# Patient Record
Sex: Male | Born: 1940 | Race: White | Hispanic: No | Marital: Married | Smoking: Never smoker
Health system: Southern US, Community
[De-identification: ages and names within clinical notes are randomized; demographics above are authoritative.]

## PROBLEM LIST (undated history)

## (undated) DIAGNOSIS — Z8711 Personal history of peptic ulcer disease: Secondary | ICD-10-CM

## (undated) DIAGNOSIS — Z8619 Personal history of other infectious and parasitic diseases: Secondary | ICD-10-CM

## (undated) DIAGNOSIS — Z8719 Personal history of other diseases of the digestive system: Secondary | ICD-10-CM

## (undated) HISTORY — PX: APPENDECTOMY: SHX54

## (undated) HISTORY — DX: Personal history of other diseases of the digestive system: Z87.19

## (undated) HISTORY — DX: Personal history of peptic ulcer disease: Z87.11

## (undated) HISTORY — DX: Personal history of other infectious and parasitic diseases: Z86.19

---

## 1982-07-09 HISTORY — PX: CHOLECYSTECTOMY: SHX55

## 2003-07-10 HISTORY — PX: COLONOSCOPY: SHX174

## 2006-07-09 HISTORY — PX: ESOPHAGOGASTRODUODENOSCOPY ENDOSCOPY: SHX5814

## 2007-05-20 ENCOUNTER — Other Ambulatory Visit: Payer: Self-pay

## 2007-05-20 ENCOUNTER — Inpatient Hospital Stay: Payer: Self-pay | Admitting: Internal Medicine

## 2007-05-24 ENCOUNTER — Other Ambulatory Visit: Payer: Self-pay

## 2007-05-24 ENCOUNTER — Inpatient Hospital Stay: Payer: Self-pay | Admitting: Internal Medicine

## 2007-05-30 ENCOUNTER — Other Ambulatory Visit: Payer: Self-pay

## 2007-06-01 ENCOUNTER — Other Ambulatory Visit: Payer: Self-pay

## 2007-06-22 DIAGNOSIS — N529 Male erectile dysfunction, unspecified: Secondary | ICD-10-CM

## 2007-06-22 DIAGNOSIS — E7800 Pure hypercholesterolemia, unspecified: Secondary | ICD-10-CM

## 2007-07-02 ENCOUNTER — Ambulatory Visit: Payer: Self-pay | Admitting: Gastroenterology

## 2007-07-02 LAB — HM COLONOSCOPY: HM COLON: NORMAL

## 2009-03-10 ENCOUNTER — Ambulatory Visit: Payer: Self-pay | Admitting: Family Medicine

## 2009-03-10 DIAGNOSIS — I10 Essential (primary) hypertension: Secondary | ICD-10-CM | POA: Insufficient documentation

## 2010-07-26 IMAGING — CR DG TIBIA/FIBULA 2V*L*
1 series · 2 of 2 positions shown · non-contrast
Comparison: none

REASON FOR EXAM: pain
COMMENTS:

PROCEDURE:     KDR - KDXR TIBIA AND FIBULA LT(LOW LEG  - March 10, 2009  [DATE]
RESULT:     No fracture, dislocation or other acute bony abnormality is
identified. No periosteal reactive changes are seen. No radiodense soft
tissue foreign body is noted.

[Series 2: view not recorded · 0.17mm/px · 2 of 2 slices shown]
[im 1/2]
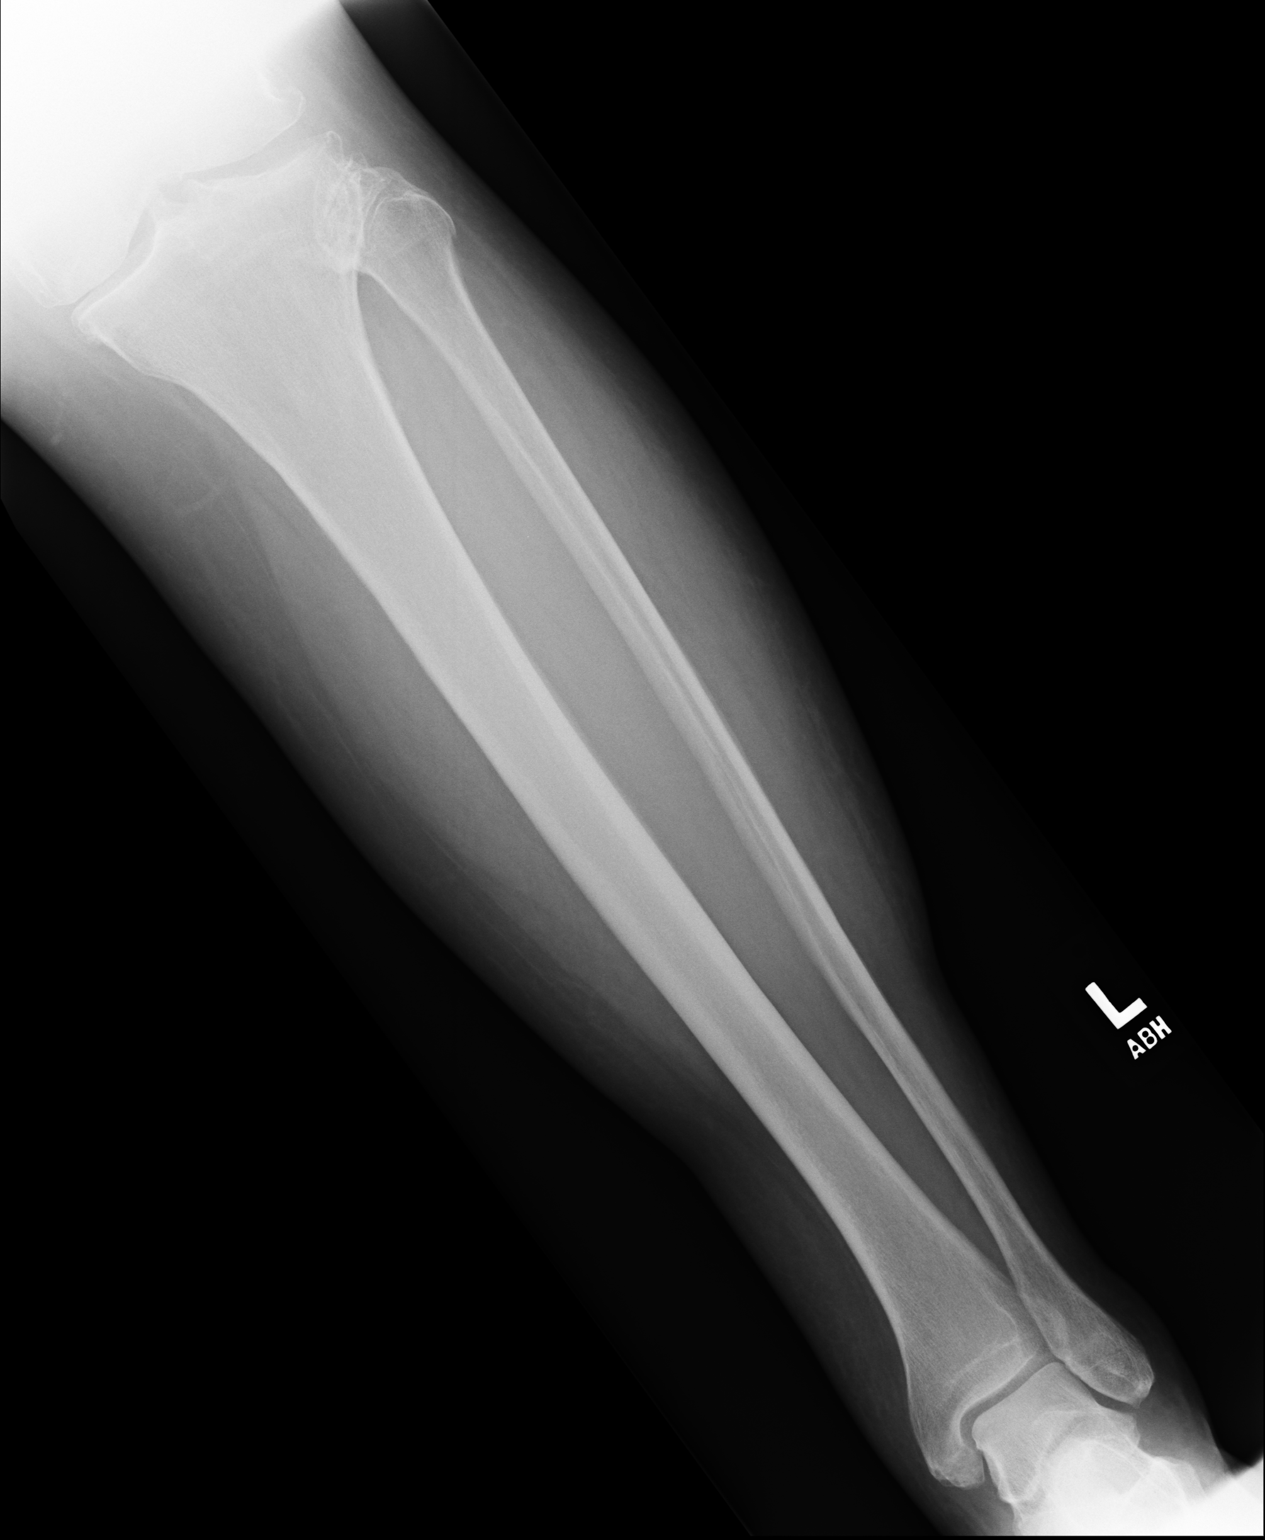
[im 2/2]
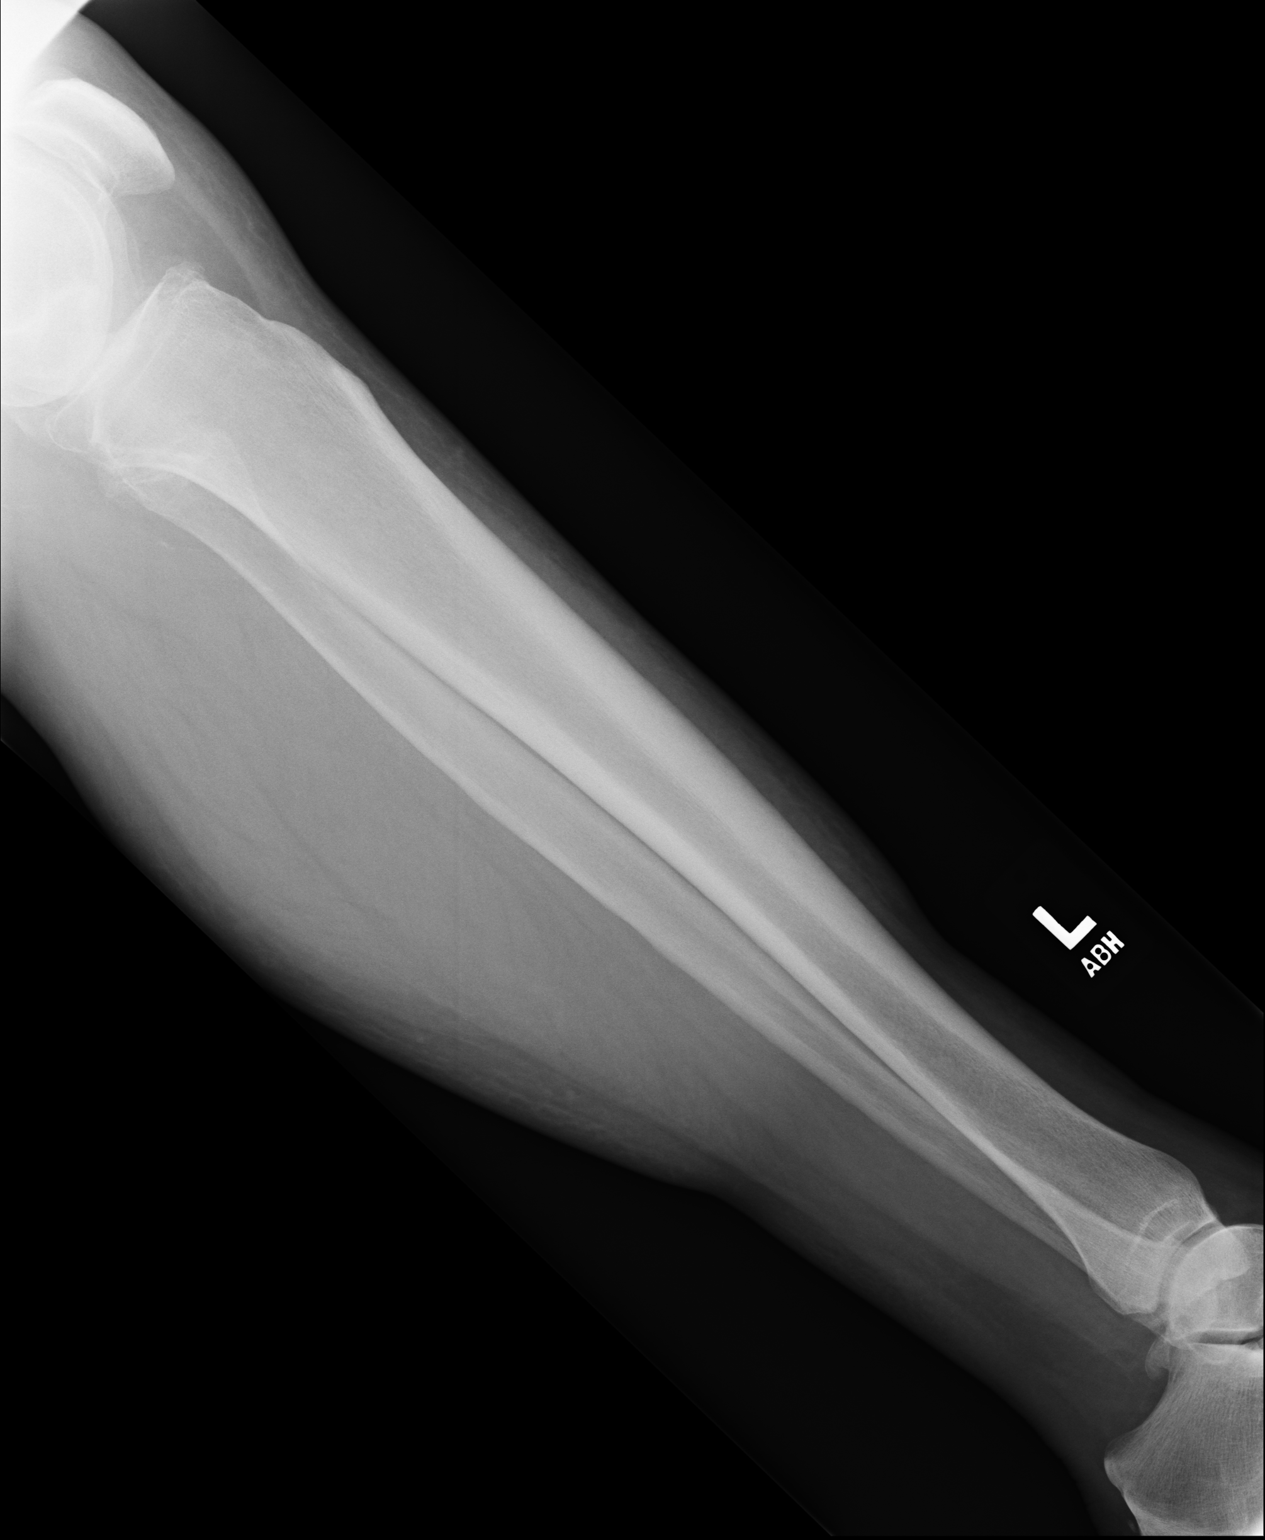

[2 of 2 positions shown; findings below may reference images not displayed]

IMPRESSION: No significant abnormalities are noted.

## 2010-08-16 ENCOUNTER — Ambulatory Visit: Payer: Self-pay | Admitting: Family Medicine

## 2012-01-01 IMAGING — CR DG CHEST 2V
1 series · 3 of 3 positions shown · non-contrast
Comparison: none

REASON FOR EXAM: sob
COMMENTS:

[Series 1: view not recorded · 0.17mm/px · 3 of 3 slices shown]
[im 1/3]
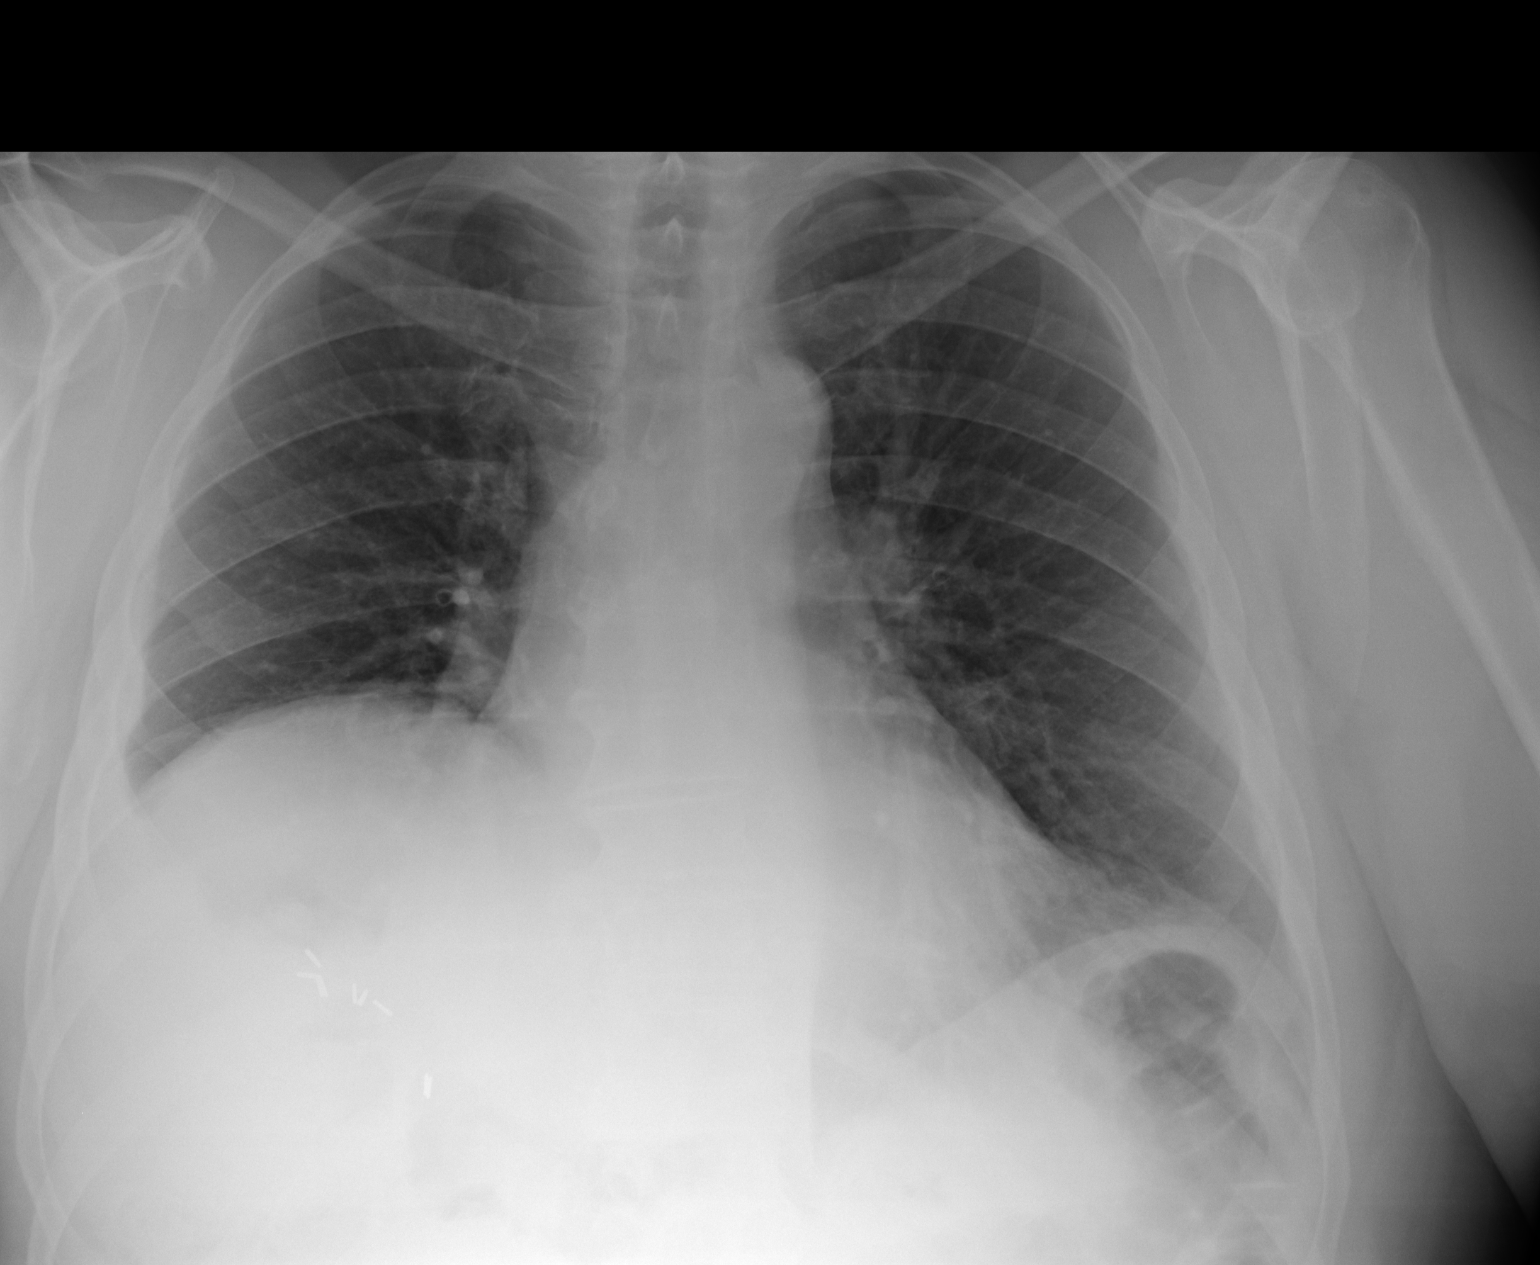
[im 2/3]
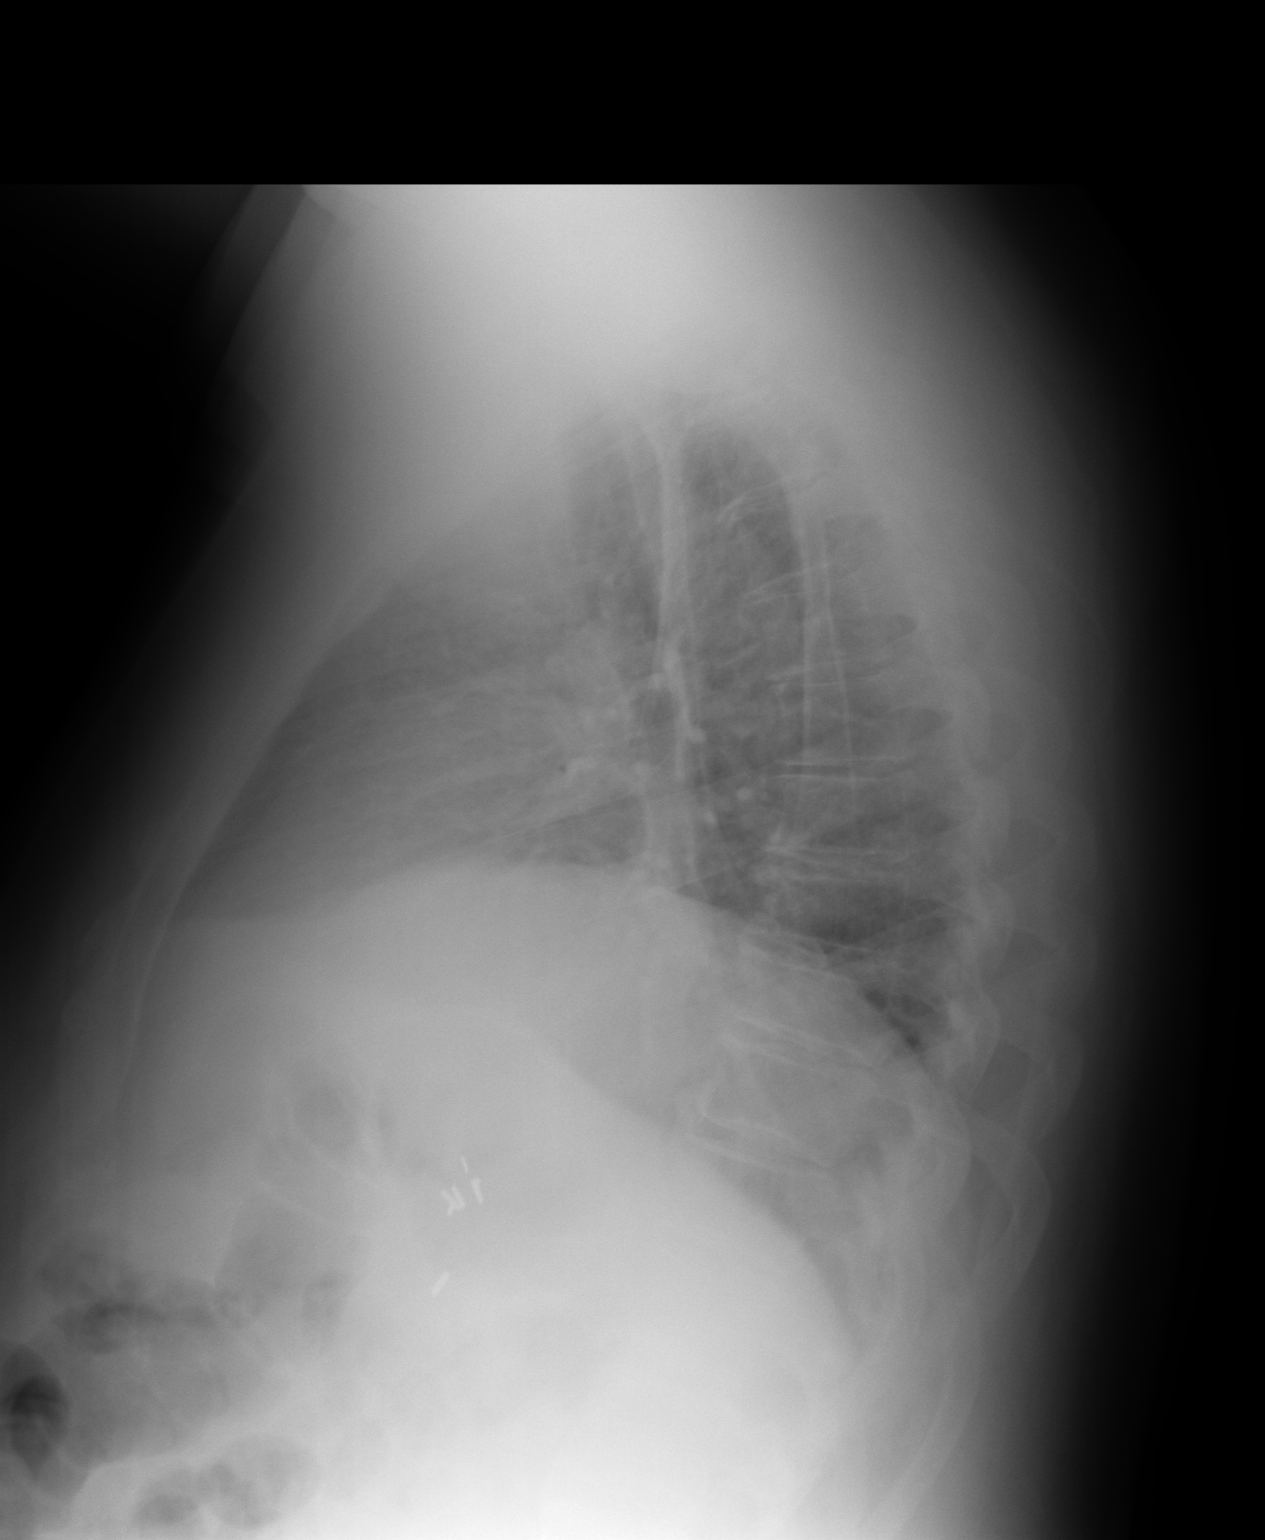
[im 3/3]
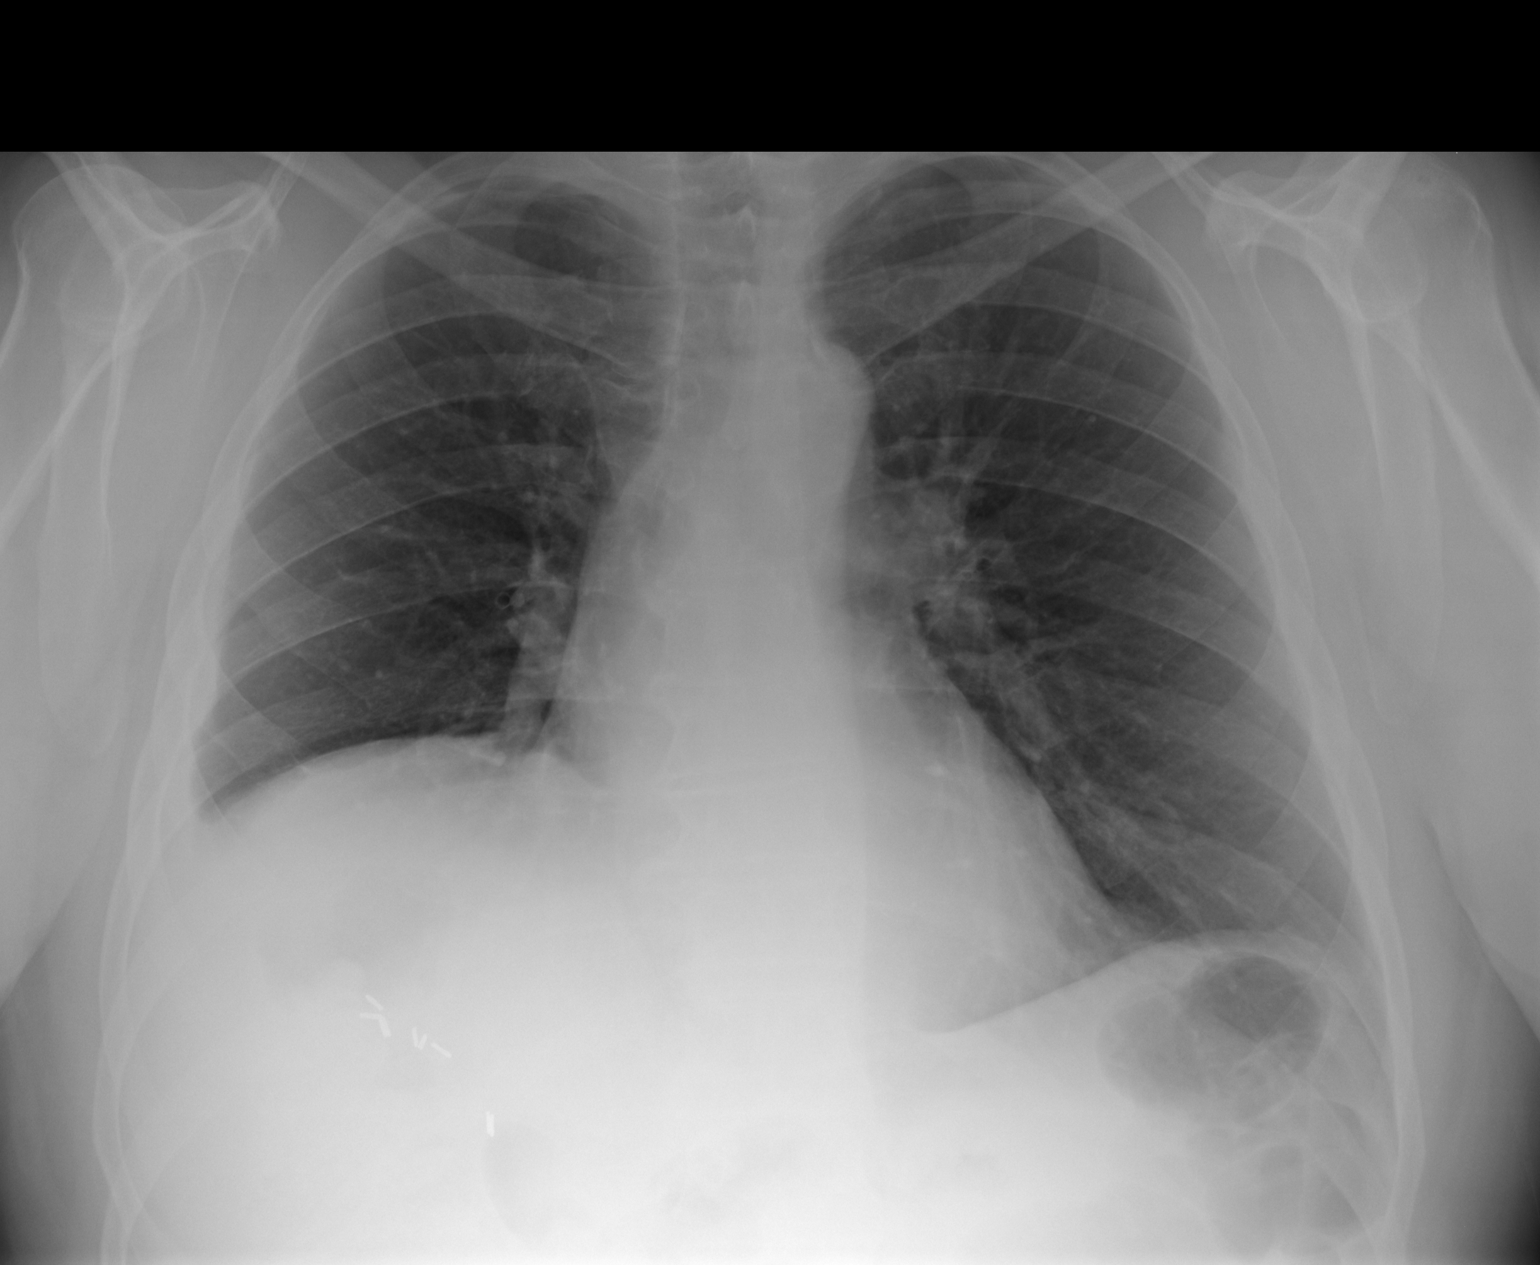

[3 of 3 positions shown; findings below may reference images not displayed]

PROCEDURE:     KDR - KDXR CHEST PA (OR AP) AND LAT  - August 16, 2010  [DATE]

RESULT:     Comparison is made to a prior study dated 05/28/2007.

There is elevation of the right hemidiaphragm. No focal regions of
consolidation or focal infiltrates appreciated. The cardiac silhouette and
visualized bony skeleton is unremarkable.
IMPRESSION: Elevation of the right hemidiaphragm without evidence of acute
cardiopulmonary disease.

## 2015-07-01 ENCOUNTER — Encounter: Payer: Self-pay | Admitting: Family Medicine

## 2015-07-01 ENCOUNTER — Telehealth: Payer: Self-pay

## 2015-07-01 ENCOUNTER — Ambulatory Visit (INDEPENDENT_AMBULATORY_CARE_PROVIDER_SITE_OTHER): Payer: Medicare Other | Admitting: Family Medicine

## 2015-07-01 VITALS — BP 116/64 | HR 61 | Temp 97.8°F | Resp 16 | Wt 259.0 lb

## 2015-07-01 DIAGNOSIS — Z8719 Personal history of other diseases of the digestive system: Secondary | ICD-10-CM

## 2015-07-01 DIAGNOSIS — L709 Acne, unspecified: Secondary | ICD-10-CM | POA: Insufficient documentation

## 2015-07-01 DIAGNOSIS — B356 Tinea cruris: Secondary | ICD-10-CM | POA: Insufficient documentation

## 2015-07-01 DIAGNOSIS — Z86010 Personal history of colonic polyps: Secondary | ICD-10-CM

## 2015-07-01 DIAGNOSIS — S161XXS Strain of muscle, fascia and tendon at neck level, sequela: Secondary | ICD-10-CM | POA: Diagnosis not present

## 2015-07-01 DIAGNOSIS — N529 Male erectile dysfunction, unspecified: Secondary | ICD-10-CM

## 2015-07-01 DIAGNOSIS — K259 Gastric ulcer, unspecified as acute or chronic, without hemorrhage or perforation: Secondary | ICD-10-CM | POA: Insufficient documentation

## 2015-07-01 DIAGNOSIS — K629 Disease of anus and rectum, unspecified: Secondary | ICD-10-CM | POA: Insufficient documentation

## 2015-07-01 DIAGNOSIS — Z8679 Personal history of other diseases of the circulatory system: Secondary | ICD-10-CM | POA: Insufficient documentation

## 2015-07-01 DIAGNOSIS — E669 Obesity, unspecified: Secondary | ICD-10-CM

## 2015-07-01 MED ORDER — KETOCONAZOLE 2 % EX CREA: 1.0000 "application " | TOPICAL_CREAM | Freq: Every day | CUTANEOUS | Status: DC

## 2015-07-01 MED ORDER — TIZANIDINE HCL 4 MG PO TABS: 4.0000 mg | ORAL_TABLET | Freq: Every day | ORAL | Status: DC

## 2015-07-01 NOTE — Telephone Encounter (Signed)
Patient called saying that [place] and he has developed a rash in his groin area. He reports that is has worsened over the past week. Patient reports that it is mildly itchy, and starting to spread. He has had a shingles vaccine in 2011. Patient has not been using anything OTC for symptoms.

## 2015-07-01 NOTE — Progress Notes (Signed)
Patient: Zachary FinlayKenneth L Clubb Male    DOB: 11/01/1940   74 y.o.   MRN: [id number] Visit Date: 07/01/2015  Today's Provider: Mila Merryonald Fisher, MD   Chief Complaint  Patient presents with  . Rash   Subjective:    Rash This is a new problem. The current episode started 1 to 4 weeks ago (22 days). The problem has been gradually worsening since onset. The affected locations include the groin. The rash is characterized by burning and itchiness. He was exposed to nothing. Associated symptoms include coughing, diarrhea and fatigue. Pertinent negatives include no congestion, eye pain, facial edema, fever, joint pain, nail changes, shortness of breath, sore throat or vomiting. Past treatments include antihistamine. The treatment provided mild relief.  Neck Pain  This is a recurrent problem. The current episode started more than 1 year ago (1 year). The problem has been gradually worsening. The pain is associated with nothing. The pain is present in the left side. The quality of the pain is described as stabbing. The pain is at a severity of 6/10. The pain is moderate. The symptoms are aggravated by twisting, position and stress. Stiffness is present all day. Associated symptoms include numbness and tingling. Pertinent negatives include no fever, headaches or weakness. Treatments tried: stop pain cream. The treatment provided mild relief.    Rash on left side of groin area for 3 weeks. Rash is skin colored. Burning and itching.   Also having neck pain for the last year. Radiates down the left side of his back. He lives mostly in [place] with his wife who has dementia. Is usually prescribed paracetamol.     Allergies  Allergen Reactions  . Penicillins   . Vancomycin    Previous Medications   ATORVASTATIN (LIPITOR) 10 MG TABLET    Take 10 mg by mouth daily.   MULTIPLE VITAMIN (MULTIVITAMIN) CAPSULE    Take 1 capsule by mouth daily.   OMEGA-3 FATTY ACIDS (FISH OIL) 1200 MG CAPS    Take 1 capsule  by mouth.   OMEPRAZOLE (PRILOSEC) 20 MG CAPSULE    Take 20 mg by mouth daily. Reported on 07/01/2015   PERINDOPRIL (ACEON) 8 MG TABLET    Take 8 mg by mouth daily.    Review of Systems  Constitutional: Positive for fatigue. Negative for fever.  HENT: Negative for congestion and sore throat.   Eyes: Negative for pain.  Respiratory: Positive for cough. Negative for shortness of breath.   Gastrointestinal: Positive for diarrhea. Negative for vomiting.  Musculoskeletal: Positive for neck pain. Negative for joint pain.  Skin: Positive for rash. Negative for nail changes.  Neurological: Positive for tingling and numbness. Negative for weakness and headaches.    Social History  Substance Use Topics  . Smoking status: Never Smoker   . Smokeless tobacco: Not on file  . Alcohol Use: 0.6 oz/week    1 Standard drinks or equivalent per week   Objective:   BP 116/64 mmHg  Pulse 61  Temp(Src) 97.8 F (36.6 C) (Oral)  Resp 16  Wt 259 lb (117.482 kg)  SpO2 95%  Physical Exam  General appearance: alert, well developed, well nourished, cooperative and in no distress Extremities: No gross deformities Skin: Red irritated skin right inguinal crease Neurologic: Mental status: Alert, oriented to person, place, and time, thought content appropriate. Musc: Mild tenderness along left upper trapezius. No tenderess of C-spine. Normal muscle strength. FROM of neck with no radicular symptoms.  Assessment & Plan:     1. Tinea cruris  - ketoconazole (NIZORAL) 2 % cream; Apply 1 application topically daily.  Dispense: 15 g; Refill: 0  Call if symptoms change or if not rapidly improving.    2. Cervical strain, sequela  - tiZANidine (ZANAFLEX) 4 MG tablet; Take 1 tablet (4 mg total) by mouth at bedtime.  Dispense: 30 tablet; Refill: 0       Mila Merry, MD  [facility]

## 2015-07-07 ENCOUNTER — Encounter: Payer: Self-pay | Admitting: Family Medicine

## 2015-07-07 DIAGNOSIS — K219 Gastro-esophageal reflux disease without esophagitis: Secondary | ICD-10-CM | POA: Insufficient documentation

## 2016-04-09 ENCOUNTER — Ambulatory Visit (INDEPENDENT_AMBULATORY_CARE_PROVIDER_SITE_OTHER): Payer: Medicare Other | Admitting: Family Medicine

## 2016-04-09 ENCOUNTER — Ambulatory Visit
Admission: RE | Admit: 2016-04-09 | Discharge: 2016-04-09 | Disposition: A | Discharge status: Home / Self Care | Payer: Medicare Other | Source: Ambulatory Visit | Attending: Family Medicine | Admitting: Family Medicine

## 2016-04-09 ENCOUNTER — Encounter: Payer: Self-pay | Admitting: Family Medicine

## 2016-04-09 VITALS — BP 118/70 | HR 60 | Temp 98.3°F | Resp 18 | Wt 272.0 lb

## 2016-04-09 DIAGNOSIS — R05 Cough: Secondary | ICD-10-CM

## 2016-04-09 DIAGNOSIS — R0602 Shortness of breath: Secondary | ICD-10-CM

## 2016-04-09 DIAGNOSIS — F329 Major depressive disorder, single episode, unspecified: Secondary | ICD-10-CM | POA: Diagnosis not present

## 2016-04-09 DIAGNOSIS — F32A Depression, unspecified: Secondary | ICD-10-CM

## 2016-04-09 DIAGNOSIS — R059 Cough, unspecified: Secondary | ICD-10-CM

## 2016-04-09 MED ORDER — FLUOXETINE HCL (PMDD) 20 MG PO TABS: 20.0000 mg | ORAL_TABLET | Freq: Every day | ORAL | Status: DC

## 2016-04-09 MED ORDER — FLUOXETINE HCL (PMDD) 20 MG PO TABS: 20.0000 mg | ORAL_TABLET | Freq: Every day | ORAL | 1 refills | Status: DC

## 2016-04-09 NOTE — Patient Instructions (Signed)
Go to the [facility] on [address] for Chest Xray  

## 2016-04-09 NOTE — Progress Notes (Signed)
Patient: Zachary Love Male    DOB: 1941/06/03   75 y.o.   MRN: [id number] Visit Date: 04/09/2016  Today's Provider: Mila Merry, MD   Chief Complaint  Patient presents with  . Shortness of Breath    chronic   Subjective:    HPI Shortness of breath: Patient comes in today complaining of chronic shortness of breath that has worsened oin the past 3 days.  Breathing worsens when laying down. Patient states when his laying down, it feels like he is having a panic attack. Symptoms of shortness of breath improve when he sits upright. Patient reports he has had the before in the past and was told his bronchial tubes were constricted. Patient just returned from [place] 5 days ago where his wife lives and apparently declining from Alzheimer's disease. He states that he feels like he has been feeling depressed and anxious about his wife's health which contributed to his decision to return the the [place] for a period of time.  He reports long history of shortness of breath and apparently treated with antibiotics a few times when he was [place]. Reports he has been coughing up some yellow phlegm.     Allergies  Allergen Reactions  . Penicillins   . Vancomycin      Current Outpatient Prescriptions:  .  atorvastatin (LIPITOR) 10 MG tablet, Take 10 mg by mouth daily., Disp: , Rfl:  .  bisoprolol (ZEBETA) 5 MG tablet, Take 0.5 tablets by mouth daily., Disp: , Rfl:  .  ketoconazole (NIZORAL) 2 % cream, Apply 1 application topically daily., Disp: 15 g, Rfl: 0 .  Multiple Vitamin (MULTIVITAMIN) capsule, Take 1 capsule by mouth daily., Disp: , Rfl:  .  Omega-3 Fatty Acids (FISH OIL) 1200 MG CAPS, Take 1 capsule by mouth., Disp: , Rfl:  .  omeprazole (PRILOSEC) 20 MG capsule, Take 20 mg by mouth daily. Reported on 07/01/2015, Disp: , Rfl:  .  perindopril (ACEON) 8 MG tablet, Take 8 mg by mouth daily., Disp: , Rfl:  .  tiZANidine (ZANAFLEX) 4 MG tablet, Take 1 tablet (4 mg total) by mouth at  bedtime., Disp: 30 tablet, Rfl: 0  Review of Systems  Constitutional: Negative for appetite change, chills and fever.  HENT: Positive for sinus pressure. Negative for congestion, dental problem, drooling, ear discharge, ear pain, facial swelling, hearing loss, mouth sores, nosebleeds, postnasal drip, rhinorrhea, sneezing, sore throat, trouble swallowing and voice change.   Respiratory: Positive for shortness of breath and wheezing. Negative for cough and chest tightness.   Cardiovascular: Negative for chest pain and palpitations.  Gastrointestinal: Negative for abdominal pain, nausea and vomiting.  Musculoskeletal: Positive for arthralgias (both knees), joint swelling, neck pain and neck stiffness.  Neurological: Positive for dizziness and light-headedness.    Social History  Substance Use Topics  . Smoking status: Never Smoker  . Smokeless tobacco: Never Used  . Alcohol use 0.6 oz/week    1 Standard drinks or equivalent per week   Objective:   BP 118/70 (BP Location: Left Arm, Patient Position: Sitting, Cuff Size: Large)   Pulse 60   Temp 98.3 F (36.8 C) (Oral)   Resp 18   Wt 272 lb (123.4 kg)   SpO2 95% Comment: room air  BMI 41.36 kg/m   Physical Exam  General Appearance:    Alert, cooperative, no distress  HENT:   bilateral TM normal without fluid or infection, neck without nodes, throat normal without erythema or exudate  and nasal mucosa pale and congested  Eyes:    PERRL, conjunctiva/corneas clear, EOM's intact       Lungs:     Clear to auscultation bilaterally, respirations unlabored  Heart:    Regular rate and rhythm  Neurologic:   Awake, alert, oriented x 3. No apparent focal neurological           defect.           Assessment & Plan:     1. Cough  - DG Chest 2 View; Future  2. Shortness of breath Persistent for several month. May have some underlying cardiac or pulmonary dysfunction. Consider checking CBC and BNP is CXR is normal.  - DG Chest 2 View;  Future  3. Depression, unspecified depression type He is interested in trial of SSRI.  - Fluoxetine HCl, PMDD, 20 MG TABS; Take 1 tablet (20 mg total) by mouth daily.  Dispense: 30 tablet; Refill: 1  Follow up 1 month.       Mila Merryonald Fisher, MD  [facility]

## 2016-04-10 ENCOUNTER — Telehealth: Payer: Self-pay | Admitting: *Deleted

## 2016-04-10 DIAGNOSIS — R0609 Other forms of dyspnea: Principal | ICD-10-CM

## 2016-04-10 NOTE — Telephone Encounter (Signed)
-----   Message from Malva Limesonald E Fisher, MD sent at 04/10/2016  9:09 AM EDT ----- Xrays of lungs are normal. No sign of infection. Recommend checking labs including CBC and BNP for dyspnea on exertion. This is to rule out anemia and cardiac dysfunction which are both causes of shortness of breath.

## 2016-04-12 LAB — CBC
HEMOGLOBIN: 13.8 g/dL (ref 12.6–17.7)
Hematocrit: 40.9 % (ref 37.5–51.0)
MCH: 32.1 pg (ref 26.6–33.0)
MCHC: 33.7 g/dL (ref 31.5–35.7)
MCV: 95 fL (ref 79–97)
Platelets: 237 10*3/uL (ref 150–379)
RBC: 4.3 x10E6/uL (ref 4.14–5.80)
RDW: 13.9 % (ref 12.3–15.4)
WBC: 6.6 10*3/uL (ref 3.4–10.8)

## 2016-04-12 LAB — BRAIN NATRIURETIC PEPTIDE: BNP: 108.9 pg/mL — AB (ref 0.0–100.0)

## 2016-05-01 ENCOUNTER — Ambulatory Visit (INDEPENDENT_AMBULATORY_CARE_PROVIDER_SITE_OTHER): Payer: Medicare Other | Admitting: Family Medicine

## 2016-05-01 ENCOUNTER — Encounter: Payer: Self-pay | Admitting: Family Medicine

## 2016-05-01 VITALS — BP 118/72 | HR 54 | Temp 99.0°F | Resp 16 | Wt 259.0 lb

## 2016-05-01 DIAGNOSIS — R05 Cough: Secondary | ICD-10-CM | POA: Diagnosis not present

## 2016-05-01 DIAGNOSIS — G4700 Insomnia, unspecified: Secondary | ICD-10-CM | POA: Diagnosis not present

## 2016-05-01 DIAGNOSIS — J984 Other disorders of lung: Secondary | ICD-10-CM | POA: Diagnosis not present

## 2016-05-01 DIAGNOSIS — R0602 Shortness of breath: Secondary | ICD-10-CM | POA: Diagnosis not present

## 2016-05-01 DIAGNOSIS — F32A Depression, unspecified: Secondary | ICD-10-CM

## 2016-05-01 DIAGNOSIS — F329 Major depressive disorder, single episode, unspecified: Secondary | ICD-10-CM

## 2016-05-01 DIAGNOSIS — R059 Cough, unspecified: Secondary | ICD-10-CM

## 2016-05-01 MED ORDER — LORAZEPAM 0.5 MG PO TABS: 0.5000 mg | ORAL_TABLET | Freq: Every day | ORAL | 3 refills | Status: DC

## 2016-05-01 NOTE — Progress Notes (Signed)
Patient: Zachary Love Male    DOB: 05/07/1941   75 y.o.   MRN: [id number] Visit Date: 05/01/2016  Today's Provider: Mila Merryonald Sharonna Vinje, MD   Chief Complaint  Patient presents with  . Shortness of Breath    follow up  . Depression    follow up   Subjective:    HPI Follow up Shortness of breath:  Patient was last seen for this problem 04/09/2016. Labs and chest x ray were checked during that visit and both were normal. Patient comes in today stating his shortness of breath has improved since the last visit. He now feels shortness of breath was due to anxiety attacks which seem to have resolved since starting fluoxetine. He does still having persistent chest congestion and cough.   Follow up Depression:   Patient was last seen for this problem 04/09/2016 and was started on Fluoxetine 20mg  daily. Patient comes in today reporting good compliance with treatment, good tolerance and fair symptom control. Patient heels that the Depression has improved since the last visit. He also reports he will be returning to [place] for a few weeks next month.   He states he still had difficulty sleep through the night. He wakes up every couple of hours. He requests medication to help sleep better.     Allergies  Allergen Reactions  . Penicillins   . Vancomycin      Current Outpatient Prescriptions:  .  atorvastatin (LIPITOR) 10 MG tablet, Take 10 mg by mouth daily., Disp: , Rfl:  .  bisoprolol (ZEBETA) 5 MG tablet, Take 0.5 tablets by mouth daily., Disp: , Rfl:  .  Fluoxetine HCl, PMDD, 20 MG TABS, Take 1 tablet (20 mg total) by mouth daily., Disp: 30 tablet, Rfl: 1 .  ketoconazole (NIZORAL) 2 % cream, Apply 1 application topically daily., Disp: 15 g, Rfl: 0 .  omeprazole (PRILOSEC) 20 MG capsule, Take 20 mg by mouth daily. Reported on 07/01/2015, Disp: , Rfl:  .  perindopril (ACEON) 8 MG tablet, Take 8 mg by mouth daily., Disp: , Rfl:  .  tiZANidine (ZANAFLEX) 4 MG tablet, Take 1 tablet (4  mg total) by mouth at bedtime., Disp: 30 tablet, Rfl: 0 .  Multiple Vitamin (MULTIVITAMIN) capsule, Take 1 capsule by mouth daily., Disp: , Rfl:  .  Omega-3 Fatty Acids (FISH OIL) 1200 MG CAPS, Take 1 capsule by mouth., Disp: , Rfl:   Review of Systems  Constitutional: Positive for appetite change (decrease in appetite). Negative for chills and fever.  Respiratory: Positive for shortness of breath. Negative for chest tightness and wheezing.   Cardiovascular: Negative for chest pain and palpitations.  Gastrointestinal: Negative for abdominal pain, nausea and vomiting.  Endocrine: Negative for cold intolerance, heat intolerance, polydipsia, polyphagia and polyuria.  Psychiatric/Behavioral: Positive for dysphoric mood, sleep disturbance and suicidal ideas (has improved since last office visit). Negative for agitation, confusion, decreased concentration, hallucinations and self-injury. The patient is nervous/anxious.     Social History  Substance Use Topics  . Smoking status: Never Smoker  . Smokeless tobacco: Never Used  . Alcohol use 0.6 oz/week    1 Standard drinks or equivalent per week   Objective:   BP 118/72 (BP Location: Left Arm, Patient Position: Sitting, Cuff Size: Large)   Pulse (!) 54   Temp 99 F (37.2 C) (Oral)   Resp 16   Wt 259 lb (117.5 kg)   SpO2 95% Comment: room air  BMI 39.38 kg/m  Physical Exam   General Appearance:    Alert, cooperative, no distress  Eyes:    PERRL, conjunctiva/corneas clear, EOM's intact       Lungs:     Clear to auscultation bilaterally, respirations unlabored  Heart:    Regular rate and rhythm  Neurologic:   Awake, alert, oriented x 3. No apparent focal neurological           defect.       Spirometry: Moderate restriction.     Assessment & Plan:     1. Shortness of breath Multifactorial with underlying restrictive lung disease, aggravated by anxiety. Has significantly improved on fluoxetine and he does not wish to start inhaler at  this time.  - Spirometry with graph; Future - Spirometry with graph  2. Cough  - Spirometry with graph; Future - Spirometry with graph  3. Depression, unspecified depression type Improved on 20mg  fluoxetine. Continue for now and follow up in 3 monhts.   4. Restrictive lung disease   5. Insomnia, unspecified type  - LORazepam (ATIVAN) 0.5 MG tablet; Take 1 tablet (0.5 mg total) by mouth at bedtime.  Dispense: 30 tablet; Refill: 3       Mila Merry, MD  [facility]

## 2017-05-23 ENCOUNTER — Ambulatory Visit: Payer: Medicare Other | Admitting: Family Medicine

## 2017-05-23 ENCOUNTER — Encounter: Payer: Self-pay | Admitting: Family Medicine

## 2017-05-23 VITALS — BP 100/70 | HR 54 | Temp 98.1°F | Resp 15 | Wt 254.0 lb

## 2017-05-23 DIAGNOSIS — R197 Diarrhea, unspecified: Secondary | ICD-10-CM | POA: Diagnosis not present

## 2017-05-23 DIAGNOSIS — F339 Major depressive disorder, recurrent, unspecified: Secondary | ICD-10-CM | POA: Diagnosis not present

## 2017-05-23 DIAGNOSIS — R05 Cough: Secondary | ICD-10-CM

## 2017-05-23 DIAGNOSIS — R053 Chronic cough: Secondary | ICD-10-CM

## 2017-05-23 MED ORDER — SERTRALINE HCL 50 MG PO TABS: 50.0000 mg | ORAL_TABLET | Freq: Every day | ORAL | 0 refills | Status: DC

## 2017-05-23 NOTE — Progress Notes (Signed)
Subjective:     Patient ID: Zachary Love, male   DOB: 03/19/1941, 76 y.o.   MRN: [id number] Chief Complaint  Patient presents with  . Cough    Pateint comes in office today with complaints of cough when laying down for over the past 3 months or more.  . Diarrhea    Patient reports soft watery stools for 6 weeks, he denies blood or mucous in stool.   States he will get out of bed and sit up and that will improve the cough. Reports it is non-productive. Previously on omeprazole but discontinued due to hx of chest infections. Remains on an ACE inhibitor. [place] wife has recently been transferred to a nursing home due to Alzheimer's: "I am depressed." Previously on fluoxetine but discontinued it. Reports previous bowel pattern was once every other day. Now he is having 4 soft stools daily.  HPI   Review of Systems  Gastrointestinal:       Normal colonoscopy 2008.       Objective:   Physical Exam  Constitutional: He appears well-developed and well-nourished. No distress.  Abdominal: Soft. Bowel sounds are normal. There is no tenderness.  Psychiatric: His behavior is normal.  Sad affect       Assessment:    1. Chronic cough: ? Mediated by reflux ? ACE inhibitor  2. Diarrhea, unspecified type: ? Stress equivalent, continue imodium as needed  3. Depression, recurrent (HCC): start sertraline 50 mg. #30.    Plan:    Start Zantac 150 mg bid, If cough not improving in a week, stop ACE inhibitor. F/u in 2 weeks with primary M.D., Dr. Sherrie MustacheFisher.

## 2017-05-23 NOTE — Patient Instructions (Addendum)
Start Zantac over the counter 150 mg.twice daily. If cough not improivng in a week stop perindopril.

## 2017-05-27 ENCOUNTER — Telehealth: Payer: Self-pay | Admitting: Family Medicine

## 2017-06-06 ENCOUNTER — Encounter: Payer: Self-pay | Admitting: Family Medicine

## 2017-06-06 ENCOUNTER — Ambulatory Visit: Payer: Medicare Other | Admitting: Family Medicine

## 2017-06-06 ENCOUNTER — Other Ambulatory Visit: Payer: Self-pay

## 2017-06-06 VITALS — BP 110/70 | HR 53 | Temp 97.7°F | Resp 16 | Ht 68.0 in | Wt 252.0 lb

## 2017-06-06 DIAGNOSIS — F329 Major depressive disorder, single episode, unspecified: Secondary | ICD-10-CM | POA: Diagnosis not present

## 2017-06-06 DIAGNOSIS — E7800 Pure hypercholesterolemia, unspecified: Secondary | ICD-10-CM | POA: Diagnosis not present

## 2017-06-06 DIAGNOSIS — R05 Cough: Secondary | ICD-10-CM

## 2017-06-06 DIAGNOSIS — I10 Essential (primary) hypertension: Secondary | ICD-10-CM

## 2017-06-06 DIAGNOSIS — G4700 Insomnia, unspecified: Secondary | ICD-10-CM

## 2017-06-06 DIAGNOSIS — F32A Depression, unspecified: Secondary | ICD-10-CM

## 2017-06-06 DIAGNOSIS — R053 Chronic cough: Secondary | ICD-10-CM

## 2017-06-06 MED ORDER — BISOPROLOL FUMARATE 5 MG PO TABS: 2.5000 mg | ORAL_TABLET | Freq: Every day | ORAL | 1 refills | Status: AC

## 2017-06-06 MED ORDER — SERTRALINE HCL 50 MG PO TABS: 50.0000 mg | ORAL_TABLET | Freq: Every day | ORAL | 0 refills | Status: DC

## 2017-06-06 NOTE — Progress Notes (Signed)
Patient: Zachary Love Male    DOB: 04/24/1941   76 y.o.   MRN: [id number] Visit Date: 06/06/2017  Today's Provider: Mila Merryonald Fisher, MD   Chief Complaint  Patient presents with  . Follow-up  . Cough  . Depression  . Hypertension   Subjective:    HPI  Chronic Cough From 05/23/2017-saw Toni ArthursBob Chauvin for cough thought secondary to GERD. Started Zantac 150 mg bid. He has only been taking one at night, but states cough has dramatically improved since then. Is not having heart burn or any other reflux symptoms.    Depression, unspecified depression type From 05/23/2017-saw Toni ArthursBob chauvin, started sertraline 50 mg qd at 1/2 tablet a day. Has since increase to 1 tablet, but only taking every couple of days. He is having some anxiety since he found out [place] Is being moved to a nursing home. He is returning to [place] In mid December. He is tolerating sertraline well, but hasn't noticed an affect on his mood.   Follow up hypertension  He continues on bisoprolol and perindopril which he is tolerating well without adverse effects. Also continues on atorvastatin which he is tolerating well. He reports he has had lipids checked in [place] Within the last year .      Allergies  Allergen Reactions  . Penicillins   . Vancomycin      Current Outpatient Medications:  .  atorvastatin (LIPITOR) 10 MG tablet, Take 10 mg by mouth daily., Disp: , Rfl:  .  Multiple Vitamin (MULTIVITAMIN) capsule, Take 1 capsule by mouth daily., Disp: , Rfl:  .  Omega-3 Fatty Acids (FISH OIL) 1200 MG CAPS, Take 1 capsule by mouth., Disp: , Rfl:  .  perindopril (ACEON) 8 MG tablet, Take 8 mg by mouth daily., Disp: , Rfl:  .  sertraline (ZOLOFT) 50 MG tablet, Take 1 tablet (50 mg total) by mouth daily., Disp: 30 tablet, Rfl: 0 .  bisoprolol (ZEBETA) 2.5 MG tablet, Take 1 tablets (2.5 mg total) by mouth daily., Disp: 30 tablet, Rfl: 1  Review of Systems  Constitutional: Negative for appetite  change, chills and fever.  Respiratory: Negative for chest tightness, shortness of breath and wheezing.   Cardiovascular: Negative for chest pain and palpitations.  Gastrointestinal: Negative for abdominal pain, nausea and vomiting.    Social History   Tobacco Use  . Smoking status: Never Smoker  . Smokeless tobacco: Never Used  Substance Use Topics  . Alcohol use: Yes    Alcohol/week: 0.6 oz    Types: 1 Standard drinks or equivalent per week   Objective:   BP 110/70 (BP Location: Right Arm, Patient Position: Sitting, Cuff Size: Large)   Pulse (!) 53   Temp 97.7 F (36.5 C) (Oral)   Resp 16   Ht 5\' 8"  (1.727 m)   Wt 252 lb (114.3 kg)   SpO2 96%   BMI 38.32 kg/m  Vitals:   06/06/17 1144  BP: 110/70  Pulse: (!) 53  Resp: 16  Temp: 97.7 F (36.5 C)  TempSrc: Oral  SpO2: 96%  Weight: 252 lb (114.3 kg)  Height: 5\' 8"  (1.727 m)     Physical Exam   General Appearance:    Alert, cooperative, no distress  Eyes:    PERRL, conjunctiva/corneas clear, EOM's intact       Lungs:     Clear to auscultation bilaterally, respirations unlabored  Heart:    Regular rate and rhythm  Neurologic:  Awake, alert, oriented x 3. No apparent focal neurological           defect.           Assessment & Plan:     1. Depression, unspecified depression type Tolerating sertraline well, but not taking consistently enough to determine effectiveness. He is to start taking every day and [place] few weeks.   2. Chronic cough Likely secondary to GERD, and has mostly resolved since starting ranitidine.   3. Benign essential HTN Well controlled. . Sent refill bisoprolol to his pharmacy   4. Pure hypercholesterolemia He is tolerating atorvastatin well with no adverse effects.    5. Insomnia, unspecified type Likely secondary to anxiety and depression. Follow [place]CP [place] after he has had sufficient trial of sertraline as above.         Mila Merryonald Fisher, MD  [facility]

## 2017-10-10 NOTE — Telephone Encounter (Signed)
complete

## 2018-04-11 ENCOUNTER — Encounter: Payer: Self-pay | Admitting: Family Medicine

## 2018-04-11 ENCOUNTER — Ambulatory Visit: Payer: Medicare Other | Admitting: Family Medicine

## 2018-04-11 VITALS — BP 112/62 | HR 57 | Temp 98.2°F | Resp 16 | Wt 245.0 lb

## 2018-04-11 DIAGNOSIS — F330 Major depressive disorder, recurrent, mild: Secondary | ICD-10-CM

## 2018-04-11 DIAGNOSIS — E7800 Pure hypercholesterolemia, unspecified: Secondary | ICD-10-CM

## 2018-04-11 DIAGNOSIS — R05 Cough: Secondary | ICD-10-CM

## 2018-04-11 DIAGNOSIS — I10 Essential (primary) hypertension: Secondary | ICD-10-CM | POA: Diagnosis not present

## 2018-04-11 DIAGNOSIS — R5383 Other fatigue: Secondary | ICD-10-CM

## 2018-04-11 DIAGNOSIS — R059 Cough, unspecified: Secondary | ICD-10-CM

## 2018-04-11 DIAGNOSIS — G4700 Insomnia, unspecified: Secondary | ICD-10-CM

## 2018-04-11 MED ORDER — AZITHROMYCIN 250 MG PO TABS: ORAL_TABLET | ORAL | 0 refills | Status: AC

## 2018-04-11 MED ORDER — TRAZODONE HCL 100 MG PO TABS: 100.0000 mg | ORAL_TABLET | Freq: Every day | ORAL | 3 refills | Status: AC

## 2018-04-11 MED ORDER — PREDNISONE 20 MG PO TABS: 20.0000 mg | ORAL_TABLET | Freq: Two times a day (BID) | ORAL | 0 refills | Status: AC

## 2018-04-11 MED ORDER — TRAZODONE HCL 100 MG PO TABS: 100.0000 mg | ORAL_TABLET | Freq: Every day | ORAL | 3 refills | Status: DC

## 2018-04-11 NOTE — Patient Instructions (Signed)
   I think you should have a home sleep study to evaluate for sleep apnea. Call my office at [phone] when you are ready to schedule this test.  . Please go to the lab draw station in [address] on the second floor of [facility]

## 2018-04-11 NOTE — Progress Notes (Signed)
Patient: Zachary Love Male    DOB: 1941-06-11   77 y.o.   MRN: [id number] Visit Date: 04/11/2018  Today's Provider: Mila Merry, MD   Chief Complaint  Patient presents with  . Depression  . Hypertension  . Hyperlipidemia  . Insomnia   Subjective:    HPI Follow up of Depression: Patient was last seen for this problem 11 months ago. During that visit, patient was advised to take Sertraline every day and follow up with his PCP in the [place]. Today patient comes in reporting this problem has worsened. He says he stopped taking his medication because he felt that it wasn't working. He was apparently taking fluoxetine in the [place] which he doesn't feel helped. His wife is in a nursing home in the [place] which has been very stressful on him. He will be returning to the [place] before the end of November.    Hypertension, follow-up:  BP Readings from Last 3 Encounters:  04/11/18 112/62  06/06/17 110/70  05/23/17 100/70    He was last seen for hypertension 11 months ago.  BP at that visit was 110/70. Management since that visit includes no changes. He reports good compliance with treatment. He is not having side effects.  He is not exercising. He is adherent to low salt diet.   Outside blood pressures are checked occasionally. He is experiencing none.  Patient denies chest pain, chest pressure/discomfort, claudication, dyspnea, exertional chest pressure/discomfort, fatigue, irregular heart beat, lower extremity edema, near-syncope, orthopnea, palpitations, paroxysmal nocturnal dyspnea, syncope and tachypnea.   Cardiovascular risk factors include advanced age (older than 19 for men, 77 for women), hypertension, male gender and sedentary lifestyle.  Use of agents associated with hypertension: none.     Weight trend: fluctuating a bit Wt Readings from Last 3 Encounters:  04/11/18 245 lb (111.1 kg)  06/06/17 252 lb (114.3 kg)  05/23/17 254 lb (115.2 kg)    Current diet: in general,  a "healthy" diet    ------------------------------------------------------------------------  Lipid/Cholesterol, Follow-up:   Last seen for this 11 months ago.  Management changes since that visit include none. . Last Lipid Panel: No results found for: CHOL, TRIG, HDL, CHOLHDL, VLDL, LDLCALC, LDLDIRECT  Risk factors for vascular disease include hypercholesterolemia and hypertension  He reports good compliance with treatment. He is not having side effects.  Current symptoms include none and have been stable. Weight trend: fluctuating a bit Prior visit with dietician: no Current diet: in general, a "healthy" diet   Current exercise: none  Wt Readings from Last 3 Encounters:  04/11/18 245 lb (111.1 kg)  06/06/17 252 lb (114.3 kg)  05/23/17 254 lb (115.2 kg)    ------------------------------------------------------------------- Follow up of Insomnia: Patient was last seen for  This problem 11 months ago and no changes were made. Patient reports this problem has worsened. He states he does feel lethargic and sleepy during the day, although he feels stress and depression contribute to this.   He also reports he has had cough and wheezing productive yellow sputum the last 10 days. No dyspnea or fever. States has similar sx a few times a year which usually requires a few rounds of antibiotic and course of steroids..     Allergies  Allergen Reactions  . Penicillins   . Vancomycin      Current Outpatient Medications:  .  atorvastatin (LIPITOR) 10 MG tablet, Take 10 mg by mouth daily., Disp: , Rfl:  .  bisoprolol (ZEBETA) 5  MG tablet, Take 0.5 tablets (2.5 mg total) by mouth daily., Disp: 30 tablet, Rfl: 1 .  Multiple Vitamin (MULTIVITAMIN) capsule, Take 1 capsule by mouth daily., Disp: , Rfl:  .  Omega-3 Fatty Acids (FISH OIL) 1200 MG CAPS, Take 1 capsule by mouth., Disp: , Rfl:  .  perindopril (ACEON) 8 MG tablet, Take 8 mg by mouth daily., Disp: , Rfl:  .  sertraline (ZOLOFT)  50 MG tablet, Take 1 tablet (50 mg total) by mouth daily., Disp: 30 tablet, Rfl: 0  Review of Systems  Constitutional: Positive for fatigue. Negative for appetite change, chills and fever.  Respiratory: Positive for cough, shortness of breath and wheezing. Negative for chest tightness.   Cardiovascular: Negative for chest pain and palpitations.  Gastrointestinal: Negative for abdominal pain, nausea and vomiting.    Social History   Tobacco Use  . Smoking status: Never Smoker  . Smokeless tobacco: Never Used  Substance Use Topics  . Alcohol use: Yes    Alcohol/week: 1.0 standard drinks    Types: 1 Standard drinks or equivalent per week   Objective:   BP 112/62 (BP Location: Left Arm, Patient Position: Sitting, Cuff Size: Large)   Pulse (!) 57   Temp 98.2 F (36.8 C) (Oral)   Resp 16   Wt 245 lb (111.1 kg)   SpO2 95% Comment: room air  BMI 37.25 kg/m  Vitals:   04/11/18 1115  BP: 112/62  Pulse: (!) 57  Resp: 16  Temp: 98.2 F (36.8 C)  TempSrc: Oral  SpO2: 95%  Weight: 245 lb (111.1 kg)     Physical Exam  General Appearance:    Alert, cooperative, no distress, obese  Eyes:    PERRL, conjunctiva/corneas clear, EOM's intact       Lungs:     Occasional expiratory wheeze, no rales,  respirations unlabored  Heart:    Regular rate and rhythm  Neurologic:   Awake, alert, oriented x 3. No apparent focal neurological           defect.           Assessment & Plan:     1. Pure hypercholesterolemia He is tolerating atorvastatin well with no adverse effects.   - Comprehensive metabolic panel - Lipid panel - CBC  2. Benign essential HTN Well controlled.  Continue current medications.    3. Bronchitis  - azithromycin (ZITHROMAX) 250 MG tablet; 2 by mouth today, then 1 daily for 4 days  Dispense: 6 tablet; Refill: 0 - predniSONE (DELTASONE) 20 MG tablet; Take 1 tablet (20 mg total) by mouth 2 (two) times daily with a meal for 5 days.  Dispense: 10 tablet; Refill:  0  4. Mild episode of recurrent major depressive disorder (HCC) Stopped SSRIS which he didn't feel were effective. Will try- traZODone (DESYREL) 100 MG tablet; Take 1 tablet (100 mg total) by mouth at bedtime.  Dispense: 90 tablet; Refill: 3  5. Morbid obesity (HCC) Likely some underlying sleep apnea. Recommended follow up through with sleep study which he declined at this time.   6. Other fatigue  - TSH  7. Insomnia, unspecified type try- traZODone (DESYREL) 100 MG tablet; Take 1 tablet (100 mg total) by mouth at bedtime.  Dispense: 903 tablet; Refill: 3   follow up 1 month.       Mila Merry, MD  [facility]

## 2018-04-12 LAB — CBC
HEMATOCRIT: 40.8 % (ref 37.5–51.0)
HEMOGLOBIN: 14.1 g/dL (ref 13.0–17.7)
MCH: 33.2 pg — AB (ref 26.6–33.0)
MCHC: 34.6 g/dL (ref 31.5–35.7)
MCV: 96 fL (ref 79–97)
Platelets: 262 10*3/uL (ref 150–450)
RBC: 4.25 x10E6/uL (ref 4.14–5.80)
RDW: 12.2 % — ABNORMAL LOW (ref 12.3–15.4)
WBC: 6.5 10*3/uL (ref 3.4–10.8)

## 2018-04-12 LAB — COMPREHENSIVE METABOLIC PANEL
ALK PHOS: 54 IU/L (ref 39–117)
ALT: 23 IU/L (ref 0–44)
AST: 18 IU/L (ref 0–40)
Albumin/Globulin Ratio: 1.8 (ref 1.2–2.2)
Albumin: 4.3 g/dL (ref 3.5–4.8)
BUN/Creatinine Ratio: 9 — ABNORMAL LOW (ref 10–24)
BUN: 7 mg/dL — AB (ref 8–27)
Bilirubin Total: 0.6 mg/dL (ref 0.0–1.2)
CO2: 24 mmol/L (ref 20–29)
CREATININE: 0.74 mg/dL — AB (ref 0.76–1.27)
Calcium: 9.8 mg/dL (ref 8.6–10.2)
Chloride: 102 mmol/L (ref 96–106)
GFR calc Af Amer: 104 mL/min/{1.73_m2} (ref >59)
GFR calc non Af Amer: 90 mL/min/{1.73_m2} (ref >59)
GLUCOSE: 105 mg/dL — AB (ref 65–99)
Globulin, Total: 2.4 g/dL (ref 1.5–4.5)
Potassium: 4.7 mmol/L (ref 3.5–5.2)
Sodium: 141 mmol/L (ref 134–144)
Total Protein: 6.7 g/dL (ref 6.0–8.5)

## 2018-04-12 LAB — LIPID PANEL
CHOLESTEROL TOTAL: 130 mg/dL (ref 100–199)
Chol/HDL Ratio: 3.5 ratio (ref 0.0–5.0)
HDL: 37 mg/dL — ABNORMAL LOW (ref >39)
LDL CALC: 70 mg/dL (ref 0–99)
TRIGLYCERIDES: 115 mg/dL (ref 0–149)
VLDL Cholesterol Cal: 23 mg/dL (ref 5–40)

## 2018-04-12 LAB — TSH: TSH: 1.24 u[IU]/mL (ref 0.450–4.500)

## 2018-04-14 ENCOUNTER — Telehealth: Payer: Self-pay

## 2018-04-14 NOTE — Telephone Encounter (Signed)
Pt advised.   Thanks,   -Kevron Patella  

## 2018-04-14 NOTE — Telephone Encounter (Signed)
-----   Message from Malva Limes, MD sent at 04/12/2018  8:29 PM EDT ----- Blood sugar, kidney functions, electrolytes and cholesterol are all normal. Check labs yearly. Cholesterol is well controlled at 130. Continue current medications

## 2018-04-21 ENCOUNTER — Telehealth: Payer: Self-pay | Admitting: Family Medicine

## 2018-04-21 MED ORDER — SERTRALINE HCL 50 MG PO TABS: 50.0000 mg | ORAL_TABLET | Freq: Every day | ORAL | 5 refills | Status: DC

## 2018-04-21 NOTE — Telephone Encounter (Signed)
[facility] faxed refill request for the following medications:  sertraline (ZOLOFT) 50 MG tablet [  Qty: 30  On transferred Rx says last filled 06/19/2017  Please advise.

## 2018-05-13 ENCOUNTER — Encounter: Payer: Self-pay | Admitting: Family Medicine

## 2018-05-13 ENCOUNTER — Ambulatory Visit: Payer: Medicare Other | Admitting: Family Medicine

## 2018-05-13 VITALS — BP 104/66 | HR 56 | Temp 97.8°F | Resp 16 | Wt 258.0 lb

## 2018-05-13 DIAGNOSIS — G4700 Insomnia, unspecified: Secondary | ICD-10-CM | POA: Diagnosis not present

## 2018-05-13 DIAGNOSIS — F419 Anxiety disorder, unspecified: Secondary | ICD-10-CM | POA: Diagnosis not present

## 2018-05-13 DIAGNOSIS — F329 Major depressive disorder, single episode, unspecified: Secondary | ICD-10-CM

## 2018-05-13 DIAGNOSIS — R5383 Other fatigue: Secondary | ICD-10-CM | POA: Diagnosis not present

## 2018-05-13 DIAGNOSIS — F32A Depression, unspecified: Secondary | ICD-10-CM

## 2018-05-13 MED ORDER — VENLAFAXINE HCL ER 37.5 MG PO CP24: 37.5000 mg | ORAL_CAPSULE | Freq: Every day | ORAL | 1 refills | Status: AC

## 2018-05-13 NOTE — Patient Instructions (Addendum)
   Start venlafaxine (Effexor) by taking one capsule every morning. You can increase to 2 capsules if anxiety is not better after two weeks.    Start taking trazodone 100mg  tablet at bedtime to help you sleep.    I recommend you get a home sleep study ASAP. You can call the office to have this scheduled at your convenience.

## 2018-05-13 NOTE — Progress Notes (Signed)
Patient: Zachary Love Male    DOB: October 18, 1940   77 y.o.   MRN: [id number] Visit Date: 05/13/2018  Today's Provider: Mila Merry, MD   Chief Complaint  Patient presents with  . Insomnia  . Depression  . Knee Pain   Subjective:    Insomnia  Primary symptoms: fragmented sleep, sleep disturbance, difficulty falling asleep.  The problem occurs nightly. The problem is unchanged. Typical bedtime:  11-12 P.M..  PMH includes: depression. Prior diagnostic workup includes:  Blood work.  Knee Pain   There was no injury mechanism. The pain is present in the right knee. The pain has been improving since onset. Pertinent negatives include no inability to bear weight, loss of motion, loss of sensation, muscle weakness, numbness or tingling. He reports no foreign bodies present.  Depression         This is a chronic problem.  Associated symptoms include decreased concentration, fatigue, insomnia and decreased interest.  Associated symptoms include no appetite change, no myalgias and no suicidal ideas.  Past treatments include SSRIs - Selective serotonin reuptake inhibitors.      Allergies  Allergen Reactions  . Penicillins   . Vancomycin      Current Outpatient Medications:  .  atorvastatin (LIPITOR) 10 MG tablet, Take 10 mg by mouth daily., Disp: , Rfl:  .  bisoprolol (ZEBETA) 5 MG tablet, Take 0.5 tablets (2.5 mg total) by mouth daily., Disp: 30 tablet, Rfl: 1 .  diclofenac sodium (VOLTAREN) 1 % GEL, Apply 2 g topically 4 (four) times daily., Disp: , Rfl:  .  Multiple Vitamin (MULTIVITAMIN) capsule, Take 1 capsule by mouth daily., Disp: , Rfl:  .  Omega-3 Fatty Acids (FISH OIL) 1200 MG CAPS, Take 1 capsule by mouth., Disp: , Rfl:  .  perindopril (ACEON) 8 MG tablet, Take 8 mg by mouth daily., Disp: , Rfl:  .  traZODone (DESYREL) 100 MG tablet, Take 1 tablet (100 mg total) by mouth at bedtime. (Patient not taking: Reported on 05/13/2018), Disp: 90 tablet, Rfl: 3   Review of  Systems  Constitutional: Positive for fatigue. Negative for activity change, appetite change, chills, diaphoresis, fever and unexpected weight change.  Musculoskeletal: Positive for arthralgias, gait problem and joint swelling. Negative for back pain, myalgias, neck pain and neck stiffness.  Neurological: Negative for tingling and numbness.  Psychiatric/Behavioral: Positive for decreased concentration, depression, dysphoric mood and sleep disturbance. Negative for agitation, behavioral problems, confusion, hallucinations, self-injury and suicidal ideas. The patient is nervous/anxious and has insomnia. The patient is not hyperactive.     Social History   Tobacco Use  . Smoking status: Never Smoker  . Smokeless tobacco: Never Used  Substance Use Topics  . Alcohol use: Yes    Alcohol/week: 1.0 standard drinks    Types: 1 Standard drinks or equivalent per week   Objective:   BP 104/66 (BP Location: Right Arm, Patient Position: Sitting, Cuff Size: Large)   Pulse (!) 56   Temp 97.8 F (36.6 C) (Oral)   Resp 16   Wt 258 lb (117 kg)   BMI 39.23 kg/m     Physical Exam  General appearance: Fatigued appearing, well developed, well nourished, cooperative and in no distress, obese.  Head: Normocephalic, without obvious abnormality, atraumatic Respiratory: Respirations even and unlabored, normal respiratory rate Extremities: No gross deformities     Assessment & Plan:     1. Other fatigue Multifactorial. Likely has sleep apnea. Encouraged sleep study, but he refused  todayYork Spaniel he will think about it. He is returning to [place] the week of thanksgiving. and advised will take a few weeks to schedule.   2. Insomnia, unspecified type He has trazodone prescription but never started medication. He states he will try it.   3. Anxiety Poor response to SSR. try- venlafaxine XR (EFFEXOR-XR) 37.5 MG 24 hr capsule; Take 1 capsule (37.5 mg total) by mouth daily with breakfast.  Dispense: 30  capsule; Refill: 1 Advised he can increase to 2 capsules after 1-2 weeks if needed.   4. Depression, unspecified depression type  - venlafaxine XR (EFFEXOR-XR) 37.5 MG 24 hr capsule; Take 1 capsule (37.5 mg total) by mouth daily with breakfast.  Dispense: 30 capsule; Refill: 1        Mila Merry, MD  [facility]

## 2018-05-22 ENCOUNTER — Encounter: Payer: Self-pay | Admitting: Family Medicine

## 2018-05-22 ENCOUNTER — Ambulatory Visit (INDEPENDENT_AMBULATORY_CARE_PROVIDER_SITE_OTHER): Payer: Medicare Other | Admitting: Family Medicine

## 2018-05-22 VITALS — BP 130/72 | HR 60 | Temp 97.7°F | Resp 18 | Wt 265.0 lb

## 2018-05-22 DIAGNOSIS — R0609 Other forms of dyspnea: Secondary | ICD-10-CM

## 2018-05-22 DIAGNOSIS — R040 Epistaxis: Secondary | ICD-10-CM | POA: Diagnosis not present

## 2018-05-22 DIAGNOSIS — R609 Edema, unspecified: Secondary | ICD-10-CM | POA: Diagnosis not present

## 2018-05-22 MED ORDER — FUROSEMIDE 20 MG PO TABS: 20.0000 mg | ORAL_TABLET | Freq: Every day | ORAL | 3 refills | Status: AC

## 2018-05-22 NOTE — Progress Notes (Signed)
Patient: Zachary Love Male    DOB: 05/06/1941   77 y.o.   MRN: [id number] Visit Date: 05/22/2018  Today's Provider: Mila Merryonald Fisher, MD   Chief Complaint  Patient presents with  . Foot Swelling    x 4 days   Subjective:    HPI Swelling of both feet: Patient comes in today complaining of swelling in both feet for the past 4 days. He states the swelling has worsened since onset. He also complains of swelling in both knees, shortness of breath, dry mouth and blood in his nose. No chest pains or palpations. Has not started taking trazodone or venlafaxine prescribed at recent visit.     Allergies  Allergen Reactions  . Penicillins   . Vancomycin      Current Outpatient Medications:  .  atorvastatin (LIPITOR) 10 MG tablet, Take 10 mg by mouth daily., Disp: , Rfl:  .  bisoprolol (ZEBETA) 5 MG tablet, Take 0.5 tablets (2.5 mg total) by mouth daily., Disp: 30 tablet, Rfl: 1 .  Multiple Vitamin (MULTIVITAMIN) capsule, Take 1 capsule by mouth daily., Disp: , Rfl:  .  Omega-3 Fatty Acids (FISH OIL) 1200 MG CAPS, Take 1 capsule by mouth., Disp: , Rfl:  .  perindopril (ACEON) 8 MG tablet, Take 8 mg by mouth daily., Disp: , Rfl:  .  diclofenac sodium (VOLTAREN) 1 % GEL, Apply 2 g topically 4 (four) times daily., Disp: , Rfl:  .  traZODone (DESYREL) 100 MG tablet, Take 1 tablet (100 mg total) by mouth at bedtime. (Patient not taking: Reported on 05/22/2018), Disp: 90 tablet, Rfl: 3 .  venlafaxine XR (EFFEXOR-XR) 37.5 MG 24 hr capsule, Take 1 capsule (37.5 mg total) by mouth daily with breakfast. (Patient not taking: Reported on 05/22/2018), Disp: 30 capsule, Rfl: 1  Review of Systems  Constitutional: Negative for appetite change, chills and fever.  HENT: Positive for nosebleeds.   Respiratory: Positive for shortness of breath. Negative for chest tightness and wheezing.   Cardiovascular: Positive for leg swelling (feet swelling). Negative for chest pain and palpitations.    Gastrointestinal: Negative for abdominal pain, nausea and vomiting.  Musculoskeletal: Positive for joint swelling (both knees).  Neurological: Positive for weakness, numbness and headaches.    Social History   Tobacco Use  . Smoking status: Never Smoker  . Smokeless tobacco: Never Used  Substance Use Topics  . Alcohol use: Yes    Alcohol/week: 1.0 standard drinks    Types: 1 Standard drinks or equivalent per week   Objective:   BP 130/72 (BP Location: Left Arm, Patient Position: Sitting, Cuff Size: Large)   Pulse 60   Temp 97.7 F (36.5 C) (Oral)   Resp 18   Wt 265 lb (120.2 kg)   SpO2 98% Comment: room air  BMI 40.29 kg/m  Vitals:   05/22/18 0851  BP: 130/72  Pulse: 60  Resp: 18  Temp: 97.7 F (36.5 C)  TempSrc: Oral  SpO2: 98%  Weight: 265 lb (120.2 kg)     Physical Exam   General Appearance:    Alert, cooperative, no distress  ENT:   Scant amount dried blood on nasal septum. No other lesions.   Eyes:    PERRL, conjunctiva/corneas clear, EOM's intact       Lungs:     Clear to auscultation bilaterally, respirations unlabored  Heart:    Regular rate and rhythm, no murmurs.   Ext:   2+ bipedal edema. No cords. No calf  tenderness. No erythema. Negative Homan's sign.       EKG NSR    Assessment & Plan:     1. Edema, unspecified type  - EKG 12-Lead - furosemide (LASIX) 20 MG tablet; Take 1 tablet (20 mg total) by mouth daily.  Dispense: 30 tablet; Refill: 3  2. Dyspnea on exertion  - EKG 12-Lead - Brain natriuretic peptide - Comprehensive metabolic panel - furosemide (LASIX) 20 MG tablet; Take 1 tablet (20 mg total) by mouth daily.  Dispense: 30 tablet; Refill: 3  3. Epistaxis. Likely due to fried mucous membranes. Encourage use of Vaseline or antibiotic ointment.       Mila Merry, MD  [facility]

## 2018-05-23 LAB — COMPREHENSIVE METABOLIC PANEL
ALT: 20 IU/L (ref 0–44)
AST: 17 IU/L (ref 0–40)
Albumin/Globulin Ratio: 1.7 (ref 1.2–2.2)
Albumin: 4 g/dL (ref 3.5–4.8)
Alkaline Phosphatase: 53 IU/L (ref 39–117)
BUN/Creatinine Ratio: 12 (ref 10–24)
BUN: 10 mg/dL (ref 8–27)
Bilirubin Total: 0.8 mg/dL (ref 0.0–1.2)
CALCIUM: 9.3 mg/dL (ref 8.6–10.2)
CO2: 25 mmol/L (ref 20–29)
CREATININE: 0.85 mg/dL (ref 0.76–1.27)
Chloride: 100 mmol/L (ref 96–106)
GFR, EST AFRICAN AMERICAN: 99 mL/min/{1.73_m2} (ref >59)
GFR, EST NON AFRICAN AMERICAN: 85 mL/min/{1.73_m2} (ref >59)
Globulin, Total: 2.3 g/dL (ref 1.5–4.5)
Glucose: 100 mg/dL — ABNORMAL HIGH (ref 65–99)
Potassium: 4.2 mmol/L (ref 3.5–5.2)
Sodium: 139 mmol/L (ref 134–144)
TOTAL PROTEIN: 6.3 g/dL (ref 6.0–8.5)

## 2018-05-23 LAB — BRAIN NATRIURETIC PEPTIDE: BNP: 73.4 pg/mL (ref 0.0–100.0)

## 2018-05-26 ENCOUNTER — Telehealth: Payer: Self-pay

## 2018-05-26 ENCOUNTER — Ambulatory Visit: Payer: Medicare Other | Admitting: Family Medicine

## 2018-05-26 NOTE — Telephone Encounter (Signed)
-----   Message from Malva Limesonald E Fisher, MD sent at 05/25/2018  9:24 AM EST ----- Labs are all normal. Continue furosemide as needed for swelling.

## 2018-05-26 NOTE — Telephone Encounter (Signed)
Pt advised.   Thanks,   -Laura  

## 2018-05-30 ENCOUNTER — Telehealth: Payer: Self-pay | Admitting: Family Medicine

## 2018-05-30 NOTE — Telephone Encounter (Signed)
-----   Message from Malva Limesonald E Zonia Caplin, MD sent at 05/13/2018 12:14 PM EST ----- Regarding: FW: see how effexor is working before week of thanksgiving.    ----- Message ----- From: Malva LimesFisher, Nyree Yonker E, MD Sent: 05/13/2018  11:32 AM EST To: Malva Limesonald E Rafael Quesada, MD Subject: see how effexor is working before week of th#  Increase to to 75mg  capsules if taking 2

## 2022-07-13 ENCOUNTER — Encounter: Payer: Self-pay | Admitting: Family Medicine

## 2022-07-13 ENCOUNTER — Ambulatory Visit: Payer: Medicare Other | Admitting: Family Medicine

## 2022-07-13 VITALS — BP 88/34 | HR 65 | Ht 68.5 in | Wt 255.7 lb

## 2022-07-13 DIAGNOSIS — I10 Essential (primary) hypertension: Secondary | ICD-10-CM

## 2022-07-13 DIAGNOSIS — R0600 Dyspnea, unspecified: Secondary | ICD-10-CM | POA: Diagnosis not present

## 2022-07-13 DIAGNOSIS — Z8679 Personal history of other diseases of the circulatory system: Secondary | ICD-10-CM

## 2022-07-13 DIAGNOSIS — F330 Major depressive disorder, recurrent, mild: Secondary | ICD-10-CM

## 2022-07-13 MED ORDER — WEGOVY 0.25 MG/0.5ML ~~LOC~~ SOAJ: 0.2500 mg | SUBCUTANEOUS | 1 refills | Status: DC

## 2022-07-13 NOTE — Progress Notes (Signed)
I,[name],acting as a Education administrator for Zachary Huh, MD.,have documented all relevant documentation on the behalf of Zachary Huh, MD,as directed by  Zachary Huh, MD while in the presence of Zachary Huh, MD.   Established patient visit   Patient: Zachary Love   DOB: [birth year]   82 y.o. Male  MRN: [id number] Visit Date: 07/13/2022  Today's healthcare provider: Lelon Huh, MD   No chief complaint on file.  Subjective    HPI  Patient is being seen due to SOB, headaches, and fatigue. He also reports wanting to discuss Semaglutide for obesity.  Patient reports symptoms have been taking place for about a year now but bothering him more frequently lately at least 1-2xs a week. Has had trouble with dyspnea for a few years. No particular trigger. No chest pains. Usually last 30-90 minutes. Not positional.   Mostly lives in the [place] and is scheduled to pulmonologist in February. 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   Medications: Outpatient Medications Prior to Visit  Medication Sig   atorvastatin (LIPITOR) 10 MG tablet Take 10 mg by mouth daily.   bisoprolol (ZEBETA) 5 MG tablet Take 0.5 tablets (2.5 mg total) by mouth daily.   furosemide (LASIX) 20 MG tablet Take 1 tablet (20 mg total) by mouth daily.   Multiple Vitamin (MULTIVITAMIN) capsule Take 1 capsule by mouth daily.   Omega-3 Fatty Acids (FISH OIL) 1200 MG CAPS Take 1 capsule by mouth.   perindopril (ACEON) 8 MG tablet Take 8 mg by mouth daily.   traZODone (DESYREL) 100 MG tablet Take 1 tablet (100 mg total) by mouth at bedtime. (Patient not taking: Reported on 05/22/2018)   venlafaxine XR (EFFEXOR-XR) 37.5 MG 24 hr capsule Take 1 capsule (37.5 mg total) by mouth daily with breakfast. (Patient not taking: Reported on 05/22/2018)   No facility-administered medications prior to visit.    Review of Systems  Constitutional:  Negative for appetite change, chills and fever.  Respiratory:   Negative for chest tightness, shortness of breath and wheezing.   Cardiovascular:  Negative for chest pain and palpitations.  Gastrointestinal:  Negative for abdominal pain, nausea and vomiting.       Objective    BP (!) 88/34 (BP Location: Right Arm, Patient Position: Sitting, Cuff Size: Large)   Pulse 65   Ht 5' 8.5" (1.74 m)   Wt 255 lb 11.2 oz (116 kg)   SpO2 96%   BMI 38.31 kg/m    Physical Exam    General: Appearance:    Mildly obese male in no acute distress  Eyes:    PERRL, conjunctiva/corneas clear, EOM's intact       Lungs:     Clear to auscultation left, diffuse rales on right, respirations unlabored  Heart:    Normal heart rate. Normal rhythm. No murmurs, rubs, or gallops.    MS:   All extremities are intact.    Neurologic:   Awake, alert, oriented x 3. No apparent focal neurological defect.        EKG: NSR  Assessment & Plan    1. Dyspnea, unspecified type Long standing, episodic. Considering history of a-fib will get home cardiac monitor.   - LONG TERM MONITOR (3-14 DAYS); Future - CBC - Brain natriuretic peptide  2. History of atrial fibrillation  - EKG 12-Lead - LONG TERM MONITOR (3-14 DAYS); Future  3. Morbid obesity ([place]) He would like to try  Semaglutide-Weight Management (WEGOVY) 0.25  MG/0.5ML SOAJ; Inject 0.25 mg into the skin once a week.  Dispense: 2 mL; Refill: 1  Counseled of potential adverse effects and mitigating strategies for GI side effects.   Advised that he will need to titrate the dose up after 1-2 months and he will need to follow up with PCP in the [place] for this since he is returning at the end of January.   4. Benign essential HTN Fairly well controlled. Continue current medications.   - Comprehensive metabolic panel - TSH  5. Mild episode of recurrent major depressive disorder (HCC) Continue current medications.         The entirety of the information documented in the History of Present Illness, Review of Systems and  Physical Exam were personally obtained by me. Portions of this information were initially documented by the CMA and reviewed by me for thoroughness and accuracy.     Zachary Huh, MD  [facility] [phone] (phone) [phone] (fax)  [place]

## 2022-07-14 LAB — CBC
Hematocrit: 40.2 % (ref 37.5–51.0)
Hemoglobin: 14 g/dL (ref 13.0–17.7)
MCHC: 34.8 g/dL (ref 31.5–35.7)
MCV: 97 fL (ref 79–97)
RBC: 4.14 x10E6/uL (ref 4.14–5.80)
WBC: 7.7 10*3/uL (ref 3.4–10.8)

## 2022-07-14 LAB — COMPREHENSIVE METABOLIC PANEL
ALT: 25 IU/L (ref 0–44)
BUN: 13 mg/dL (ref 8–27)
Bilirubin Total: 1.3 mg/dL — ABNORMAL HIGH (ref 0.0–1.2)
CO2: 23 mmol/L (ref 20–29)
Calcium: 9.5 mg/dL (ref 8.6–10.2)
Creatinine, Ser: 0.95 mg/dL (ref 0.76–1.27)
Globulin, Total: 2.8 g/dL (ref 1.5–4.5)
Sodium: 142 mmol/L (ref 134–144)
eGFR: 81 mL/min/{1.73_m2} (ref >59)

## 2022-07-16 LAB — COMPREHENSIVE METABOLIC PANEL
AST: 21 IU/L (ref 0–40)
Albumin/Globulin Ratio: 1.5 (ref 1.2–2.2)
Albumin: 4.2 g/dL (ref 3.8–4.8)
Alkaline Phosphatase: 66 IU/L (ref 44–121)
BUN/Creatinine Ratio: 14 (ref 10–24)
Chloride: 101 mmol/L (ref 96–106)
Glucose: 120 mg/dL — ABNORMAL HIGH (ref 70–99)
Potassium: 4.9 mmol/L (ref 3.5–5.2)
Total Protein: 7 g/dL (ref 6.0–8.5)

## 2022-07-16 LAB — CBC
MCH: 33.8 pg — ABNORMAL HIGH (ref 26.6–33.0)
Platelets: 221 10*3/uL (ref 150–450)
RDW: 12.6 % (ref 11.6–15.4)

## 2022-07-16 LAB — BRAIN NATRIURETIC PEPTIDE: BNP: 95.1 pg/mL (ref 0.0–100.0)

## 2022-07-16 LAB — TSH: TSH: 1.09 u[IU]/mL (ref 0.450–4.500)

## 2022-07-24 ENCOUNTER — Telehealth: Payer: Self-pay | Admitting: Family Medicine

## 2022-07-24 NOTE — Telephone Encounter (Signed)
Semaglutide-Weight Management (WEGOVY) 0.25 MG/0.5ML SOAJ  Pt has called in stating pharmacy does not know when med will be available. Pt is asking to send another similar med instead to : [facility], [place] Phone: [phone]  Fax: [phone]

## 2022-07-25 NOTE — Telephone Encounter (Signed)
Pt is leaving to go out of town and wants to get alternative RX for [facility] asap / please advise

## 2022-07-26 MED ORDER — TIRZEPATIDE-WEIGHT MANAGEMENT 2.5 MG/0.5ML ~~LOC~~ SOAJ: 2.5000 mg | SUBCUTANEOUS | 0 refills | Status: DC

## 2022-07-26 NOTE — Telephone Encounter (Signed)
The patient has made an additional phone call to follow up on their request for a prescription   The patient shares that they will be traveling on Monday 07/30/22

## 2022-07-26 NOTE — Telephone Encounter (Signed)
Have sent prescription for zepbound

## 2023-09-20 ENCOUNTER — Encounter: Payer: Self-pay | Admitting: Family Medicine

## 2023-09-20 ENCOUNTER — Ambulatory Visit: Payer: Medicare Other | Admitting: Family Medicine

## 2023-09-20 VITALS — BP 125/60 | HR 63 | Resp 16 | Ht 68.0 in | Wt 250.0 lb

## 2023-09-20 DIAGNOSIS — E7800 Pure hypercholesterolemia, unspecified: Secondary | ICD-10-CM | POA: Diagnosis not present

## 2023-09-20 DIAGNOSIS — R519 Headache, unspecified: Secondary | ICD-10-CM | POA: Diagnosis not present

## 2023-09-20 MED ORDER — ZEPBOUND 2.5 MG/0.5ML ~~LOC~~ SOAJ: 2.5000 mg | SUBCUTANEOUS | 0 refills | Status: AC

## 2023-09-20 NOTE — Patient Instructions (Signed)
 Please go to the lab draw station in [address] on the second floor of [facility]  when you are fasting for 8 hours. Normal hours are 8:00am to 11:30am and 1:00pm to 4:00pm Monday through Friday   Go to [facility] for your sinus Xrays (phone no. [phone])

## 2023-09-21 LAB — LIPID PANEL
Chol/HDL Ratio: 3.3 ratio (ref 0.0–5.0)
Cholesterol, Total: 123 mg/dL (ref 100–199)
HDL: 37 mg/dL — ABNORMAL LOW (ref >39)
LDL Chol Calc (NIH): 61 mg/dL (ref 0–99)
Triglycerides: 146 mg/dL (ref 0–149)
VLDL Cholesterol Cal: 25 mg/dL (ref 5–40)

## 2023-09-21 LAB — COMPREHENSIVE METABOLIC PANEL
ALT: 22 IU/L (ref 0–44)
AST: 22 IU/L (ref 0–40)
Albumin: 4.3 g/dL (ref 3.7–4.7)
Alkaline Phosphatase: 76 IU/L (ref 44–121)
BUN/Creatinine Ratio: 13 (ref 10–24)
BUN: 12 mg/dL (ref 8–27)
Bilirubin Total: 0.8 mg/dL (ref 0.0–1.2)
CO2: 24 mmol/L (ref 20–29)
Calcium: 9.5 mg/dL (ref 8.6–10.2)
Chloride: 103 mmol/L (ref 96–106)
Creatinine, Ser: 0.93 mg/dL (ref 0.76–1.27)
Globulin, Total: 2.6 g/dL (ref 1.5–4.5)
Glucose: 75 mg/dL (ref 70–99)
Potassium: 4.9 mmol/L (ref 3.5–5.2)
Sodium: 144 mmol/L (ref 134–144)
Total Protein: 6.9 g/dL (ref 6.0–8.5)
eGFR: 82 mL/min/{1.73_m2} (ref >59)

## 2023-09-21 LAB — CBC
Hematocrit: 41.2 % (ref 37.5–51.0)
Hemoglobin: 13.8 g/dL (ref 13.0–17.7)
MCH: 33.2 pg — ABNORMAL HIGH (ref 26.6–33.0)
MCHC: 33.5 g/dL (ref 31.5–35.7)
MCV: 99 fL — ABNORMAL HIGH (ref 79–97)
Platelets: 229 10*3/uL (ref 150–450)
RBC: 4.16 x10E6/uL (ref 4.14–5.80)
RDW: 12.5 % (ref 11.6–15.4)
WBC: 9.4 10*3/uL (ref 3.4–10.8)

## 2023-09-23 ENCOUNTER — Ambulatory Visit
Admission: RE | Admit: 2023-09-23 | Discharge: 2023-09-23 | Disposition: A | Discharge status: Home / Self Care | Source: Ambulatory Visit | Attending: Family Medicine | Admitting: Family Medicine

## 2023-09-23 DIAGNOSIS — R519 Headache, unspecified: Secondary | ICD-10-CM

## 2023-09-24 ENCOUNTER — Other Ambulatory Visit: Payer: Self-pay | Admitting: Family Medicine

## 2023-09-24 DIAGNOSIS — G4485 Primary stabbing headache: Secondary | ICD-10-CM

## 2023-09-25 ENCOUNTER — Encounter: Payer: Self-pay | Admitting: Pharmacy Technician

## 2023-09-25 ENCOUNTER — Telehealth: Payer: Self-pay | Admitting: Pharmacy Technician

## 2023-09-25 ENCOUNTER — Other Ambulatory Visit (HOSPITAL_COMMUNITY): Payer: Self-pay

## 2023-09-25 NOTE — Telephone Encounter (Signed)
 ERROR

## 2023-09-25 NOTE — Telephone Encounter (Signed)
 Pharmacy Patient Advocate Encounter   Received notification from Onbase that prior authorization for ZEPBOUND 2.5MG /0.5ML AUTO-INJECTORS is required/requested.   Insurance verification completed.   The patient is insured through [facility] .   Per test claim: PA required; PA submitted to above mentioned insurance via CoverMyMeds Key/confirmation #/EOC [facility] Status is pending

## 2023-09-26 NOTE — Telephone Encounter (Signed)
 Pharmacy Patient Advocate Encounter  Received notification from [facility] that Prior Authorization for ZEPBOUND 2.5MG /0.5ML AUTO-INJECTORS has been DENIED.  Full denial letter will be uploaded to the media tab. See denial reason below.   PA #/Case ID/Reference #: [id number]

## 2023-09-27 NOTE — Progress Notes (Signed)
 Established patient visit   Patient: Zachary Love   DOB: [birth year]   83 y.o. Male  MRN: [id number] Visit Date: 09/20/2023  Today's healthcare provider: Mila Merry, MD   Chief Complaint  Patient presents with   Medication Refill    Zepbound   Subjective    Discussed the use of AI scribe software for clinical note transcription with the patient, who gave verbal consent to proceed.  History of Present Illness   Zachary Love is an 83 year old male who presents with headaches and dry mouth.  He has been experiencing severe headaches for the past six months. These headaches occur intermittently, primarily in the evenings around 6:30 to 7:00 PM, and persist for several hours, often until 10:00 or 10:30 PM. The pain is localized above the right eye and is described as 'terrible', sometimes requiring double doses of aspirin for relief. No similar headaches have been experienced in the past. No headaches have occurred in the last two days.  In addition to headaches, he has been experiencing a dry mouth for the past three to four days. He often wakes up with a dry mouth, attributing this to breathing through his mouth more than his nose during sleep. No recent issues with runny or stuffy nose are reported.  He is currently taking medications for cholesterol and blood pressure, which he tolerates well without any issues. He wants to lose weight, specifically mentioning a goal of losing fifty pounds. He previously attempted to obtain a prescription for Zepbound 14 months ago, which was unavailable at the pharmacy at that time.  He is visiting from the [place] and plans to stay here for about three weeks. He mentions difficulties in obtaining medical appointments in the [place] and plans to have his knee replaced through a private service by May. He recalls having a cataract removed in the past and served in the [employer] for four years. No current vision problems are reported.        Medications: Outpatient Medications Prior to Visit  Medication Sig   atorvastatin (LIPITOR) 10 MG tablet Take 10 mg by mouth daily.   bisoprolol (ZEBETA) 5 MG tablet Take 0.5 tablets (2.5 mg total) by mouth daily.   furosemide (LASIX) 20 MG tablet Take 1 tablet (20 mg total) by mouth daily.   Multiple Vitamin (MULTIVITAMIN) capsule Take 1 capsule by mouth daily.   naproxen (NAPROSYN) 500 MG tablet Take 500 mg by mouth 2 (two) times daily with a meal.   Omega-3 Fatty Acids (FISH OIL) 1200 MG CAPS Take 1 capsule by mouth.   traZODone (DESYREL) 100 MG tablet Take 1 tablet (100 mg total) by mouth at bedtime.   [DISCONTINUED] tirzepatide (ZEPBOUND) 2.5 MG/0.5ML Pen Inject 2.5 mg into the skin once a week.   perindopril (ACEON) 8 MG tablet Take 8 mg by mouth daily. (Patient not taking: Reported on 09/20/2023)   venlafaxine XR (EFFEXOR-XR) 37.5 MG 24 hr capsule Take 1 capsule (37.5 mg total) by mouth daily with breakfast. (Patient not taking: Reported on 09/20/2023)   No facility-administered medications prior to visit.   Review of Systems     Objective    BP 125/60   Pulse 63   Resp 16   Ht 5\' 8"  (1.727 m)   Wt 250 lb (113.4 kg)   SpO2 98%   BMI 38.01 kg/m   Physical Exam   General Appearance:    Obese male, alert, cooperative, in no acute  distress  HENT:   ENT exam normal, no neck nodes or sinus tenderness  Eyes:    PERRL, conjunctiva/corneas clear, EOM's intact       Lungs:     Clear to auscultation bilaterally, respirations unlabored  Heart:    Normal heart rate. Normal rhythm. No murmurs, rubs, or gallops.    Neurologic:   Awake, alert, oriented x 3. No apparent focal neurological           defect.            Assessment & Plan       Headache Intermittent headaches over six months, primarily above right eye, relieved by aspirin. Differential includes sinusitis. - Order sinus x-ray at [facility] imaging center. - Consider CT scan if x-ray inconclusive.  Obesity Discussed  Zepbound for weight management, starting at 2.5 mg, titrating to 5 mg. Explained refrigeration requirement and [place] availability challenges. - Prescribe Zepbound starting at 2.5 mg, titrate to 5 mg. - Ensure understanding of refrigeration requirement. - Coordinate with [facility] pharmacy for prescription.  General Health Maintenance Emphasized importance of routine blood work for monitoring cholesterol, kidney function, and electrolytes. - Order routine blood work for cholesterol, kidney function, and electrolytes.  Follow-up Visiting from [place], returning by April 3rd. Discussed Zepbound availability challenges in [place]. - Provide directions to [facility] imaging center. - Advise to complete lab work at clinic, noting lab hours.    No follow-ups on file.      Mila Merry, MD  [facility] [phone] (phone) [phone] (fax)  [facility]

## 2023-10-04 ENCOUNTER — Encounter: Payer: Self-pay | Admitting: Family Medicine

## 2023-10-04 ENCOUNTER — Ambulatory Visit: Admitting: Family Medicine

## 2023-10-04 DIAGNOSIS — N529 Male erectile dysfunction, unspecified: Secondary | ICD-10-CM | POA: Diagnosis not present

## 2023-10-04 DIAGNOSIS — I10 Essential (primary) hypertension: Secondary | ICD-10-CM | POA: Diagnosis not present

## 2023-10-04 MED ORDER — SILDENAFIL CITRATE 100 MG PO TABS: 50.0000 mg | ORAL_TABLET | Freq: Every day | ORAL | 2 refills | Status: AC | PRN

## 2023-10-04 MED ORDER — BUPROPION HCL ER (SR) 150 MG PO TB12: ORAL_TABLET | ORAL | 1 refills | Status: AC

## 2023-10-04 MED ORDER — NALTREXONE HCL 50 MG PO TABS: ORAL_TABLET | ORAL | 1 refills | Status: AC

## 2023-10-04 NOTE — Progress Notes (Signed)
 Established patient visit   Patient: Zachary Love   DOB: [birth year]   83 y.o. Male  MRN: [id number] Visit Date: 10/04/2023  Today's healthcare provider: Mila Merry, MD   Chief Complaint  Patient presents with   Follow-up    Headaches have gone away since last visits  Wants to discuss medication options for weightloss medication and viagra  No other concerns   Subjective    HPI As seen 2 weeks ago with severe and frequent unilateral headaches over right sinuses. Had sinus xrays done which were normal, but he reports headaches went completely away within a few days of that visit and have not come back.   He was also prescribed Zepbound at that visit to help with weight loss, but is not covered by insurance. He is also returning to [place] in a few days and does not want to take any medications that are refrigerated. He is interested in trying a different medication to help lose weight.   He also request refill for 100mg  viagra which he as previously taken and done well with.   Medications: Outpatient Medications Prior to Visit  Medication Sig   atorvastatin (LIPITOR) 10 MG tablet Take 10 mg by mouth daily.   bisoprolol (ZEBETA) 5 MG tablet Take 0.5 tablets (2.5 mg total) by mouth daily.   furosemide (LASIX) 20 MG tablet Take 1 tablet (20 mg total) by mouth daily.   Multiple Vitamin (MULTIVITAMIN) capsule Take 1 capsule by mouth daily.   naproxen (NAPROSYN) 500 MG tablet Take 500 mg by mouth 2 (two) times daily with a meal.   Omega-3 Fatty Acids (FISH OIL) 1200 MG CAPS Take 1 capsule by mouth.   perindopril (ACEON) 8 MG tablet Take 8 mg by mouth daily.   traZODone (DESYREL) 100 MG tablet Take 1 tablet (100 mg total) by mouth at bedtime.   venlafaxine XR (EFFEXOR-XR) 37.5 MG 24 hr capsule Take 1 capsule (37.5 mg total) by mouth daily with breakfast.   No facility-administered medications prior to visit.    Review of Systems  Constitutional:  Negative for appetite  change, chills and fever.  Respiratory:  Negative for chest tightness, shortness of breath and wheezing.   Cardiovascular:  Negative for chest pain and palpitations.  Gastrointestinal:  Negative for abdominal pain, nausea and vomiting.       Objective    BP 108/70   Pulse (!) 57   Ht 5\' 8"  (1.727 m)   Wt 251 lb (113.9 kg)   SpO2 96%   BMI 38.16 kg/m    Physical Exam    General: Appearance:    Obese male in no acute distress  Eyes:    PERRL, conjunctiva/corneas clear, EOM's intact       Lungs:     Clear to auscultation bilaterally, respirations unlabored  Heart:    Bradycardic. Normal rhythm. No murmurs, rubs, or gallops.    MS:   All extremities are intact.    Neurologic:   Awake, alert, oriented x 3. No apparent focal neurological defect.         Assessment & Plan     1. Morbid obesity (HCC) (Primary) He prefers more cost effective medication that is not refrigerated since he is flying to [place] in a couple of days.  - buPROPion (WELLBUTRIN SR) 150 MG 12 hr tablet; Take 1 tablet daily for 14 days, then increase to 1 tablet twice a day  Dispense: 180 tablet; Refill: 1 - naltrexone (DEPADE)  50 MG tablet; Take 1/2 tablet daily for 14 days then increase to 1/2 tablet twice a day  Dispense: 90 tablet; Refill: 1  2. Primary hypertension Very well controlled. Is managed by PCP in [place]. Advised he should monitor home BP and may need to reduce medication if remains on low end.   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 He states the 100mg  have always worked well for him and never had any side effects. Advise he may need to adjust other antihypertensive medications.  - sildenafil (VIAGRA) 100 MG tablet; Take 0.5-1 tablets (50-100 mg total) by mouth daily as needed for erectile dysfunction.  Dispense: 30 tablet; Refill: 2         Mila Merry, MD  [facility] [phone]  (phone) [phone] (fax)  [facility]

## 2023-10-17 ENCOUNTER — Telehealth: Payer: Self-pay

## 2023-10-21 NOTE — Telephone Encounter (Signed)
 Error
